# Patient Record
Sex: Male | Born: 1945 | Race: White | Hispanic: No | Marital: Single | Smoking: Former smoker
Health system: Southern US, Community
[De-identification: ages and names within clinical notes are randomized; demographics above are authoritative.]

## PROBLEM LIST (undated history)

## (undated) DIAGNOSIS — I639 Cerebral infarction, unspecified: Secondary | ICD-10-CM

## (undated) DIAGNOSIS — I10 Essential (primary) hypertension: Secondary | ICD-10-CM

## (undated) DIAGNOSIS — E7800 Pure hypercholesterolemia, unspecified: Secondary | ICD-10-CM

## (undated) HISTORY — PX: OTHER SURGICAL HISTORY: SHX169

---

## 2008-11-20 ENCOUNTER — Ambulatory Visit: Payer: Self-pay | Admitting: Urology

## 2008-11-24 ENCOUNTER — Ambulatory Visit: Payer: Self-pay | Admitting: Urology

## 2011-01-18 DIAGNOSIS — I639 Cerebral infarction, unspecified: Secondary | ICD-10-CM

## 2011-01-18 HISTORY — DX: Cerebral infarction, unspecified: I63.9

## 2011-06-14 ENCOUNTER — Inpatient Hospital Stay: Payer: Self-pay | Admitting: Specialist

## 2011-06-14 LAB — COMPREHENSIVE METABOLIC PANEL
Albumin: 4.2 g/dL (ref 3.4–5.0)
Alkaline Phosphatase: 72 U/L (ref 50–136)
Anion Gap: 7 (ref 7–16)
BUN: 17 mg/dL (ref 7–18)
Calcium, Total: 8.2 mg/dL — ABNORMAL LOW (ref 8.5–10.1)
Chloride: 108 mmol/L — ABNORMAL HIGH (ref 98–107)
Co2: 27 mmol/L (ref 21–32)
Creatinine: 0.91 mg/dL (ref 0.60–1.30)
EGFR (Non-African Amer.): >60
Glucose: 85 mg/dL (ref 65–99)
Osmolality: 284 (ref 275–301)
SGOT(AST): 34 U/L (ref 15–37)
SGPT (ALT): 42 U/L
Sodium: 142 mmol/L (ref 136–145)

## 2011-06-14 LAB — CBC
Platelet: 175 10*3/uL (ref 150–440)
RDW: 13.4 % (ref 11.5–14.5)

## 2011-06-14 LAB — TROPONIN I: Troponin-I: <0.02 ng/mL

## 2011-06-15 LAB — LIPID PANEL
Triglycerides: 189 mg/dL (ref 0–200)
VLDL Cholesterol, Calc: 38 mg/dL (ref 5–40)

## 2012-12-26 ENCOUNTER — Ambulatory Visit: Payer: Self-pay | Admitting: Gastroenterology

## 2014-02-26 DIAGNOSIS — E785 Hyperlipidemia, unspecified: Secondary | ICD-10-CM | POA: Diagnosis not present

## 2014-02-26 DIAGNOSIS — F329 Major depressive disorder, single episode, unspecified: Secondary | ICD-10-CM | POA: Diagnosis not present

## 2014-02-26 DIAGNOSIS — B002 Herpesviral gingivostomatitis and pharyngotonsillitis: Secondary | ICD-10-CM | POA: Diagnosis not present

## 2014-02-26 DIAGNOSIS — I10 Essential (primary) hypertension: Secondary | ICD-10-CM | POA: Diagnosis not present

## 2014-04-07 DIAGNOSIS — I10 Essential (primary) hypertension: Secondary | ICD-10-CM | POA: Diagnosis not present

## 2014-04-07 DIAGNOSIS — E780 Pure hypercholesterolemia: Secondary | ICD-10-CM | POA: Diagnosis not present

## 2014-04-07 DIAGNOSIS — F329 Major depressive disorder, single episode, unspecified: Secondary | ICD-10-CM | POA: Diagnosis not present

## 2014-04-07 DIAGNOSIS — Z125 Encounter for screening for malignant neoplasm of prostate: Secondary | ICD-10-CM | POA: Diagnosis not present

## 2014-05-11 NOTE — H&P (Signed)
PATIENT NAME:  Aaron Hubbard, Aaron Hubbard MR#:  [id number] DATE OF BIRTH:  05/29/1945  DATE OF ADMISSION:  06/14/2011  ER REFERRING PHYSICIAN: Dr. Mayford Knife  PRIMARY CARE PHYSICIAN: None.   CHIEF COMPLAINT: Sudden onset of lightheadedness, bilateral upper extremity weakness, and difficulty walking.   HISTORY OF PRESENT ILLNESS: The patient is a 69 year old male with no significant past medical history other than benign prostatic hypertrophy who was in his usual state of health until this morning. The patient reports that he got up without any problems and went to work. Within 15 minutes of starting work the patient felt lightheaded and had to lean against the wall to stabilize himself. He felt that both his upper extremities were weak and he was having difficulty with coordination and balance and difficulty walking. Therefore, he called his brother who brought him to the Emergency Room. The patient was found to have accelerated hypertension. The patient denies having any history of hypertension in the past. He denies any dizziness, tinnitus, chest pain, cough, shortness of breath, or any other symptoms. The patient reported at work he was doing his normal activities and was not doing any excessive strenuous physical activity when the symptoms happened.   PAST MEDICAL HISTORY: Benign prostatic hypertrophy.   MEDICATIONS: None.   PAST SURGICAL HISTORY: Green light laser BVP for benign prostatic hypertrophy by Dr. Evelene Croon in November 2010.   SOCIAL HISTORY: He denies any history of smoking, alcohol, or drug abuse. He still works and works for [employer].  FAMILY HISTORY: Mother had hypertension. Father had emphysema. A brother died of some kind of metastatic cancer. She denies any history of CVA or CAD.   REVIEW OF SYSTEMS: CONSTITUTIONAL: Denies any fever or fatigue. EYES: Denies any blurred or double vision. ENT: Denies any tinnitus or ear pain. RESPIRATORY: Denies any cough, wheezing, or fevers. CVS: Denies any  chest pain or palpitations. GI: Denies any nausea, vomiting, diarrhea, or abdominal pain. GU: Denies any dysuria or hematuria. ENDOCRINE: Denies any polyuria or nocturia. HEME/LYMPH: Denies any anemia or easy bruisability. INTEGUMENT: Denies any acne or rash. MUSCULOSKELETAL: Denies any swelling or gout. NEUROLOGICAL: Reports weakness in his upper extremities and difficulty walking. PSYCH: Denies any history of anxiety or depression.   PHYSICAL EXAMINATION:   VITAL SIGNS: Temperature 95.8, heart rate 71, respiratory rate 18, blood pressure 224/105, and pulse oximetry 97% on room air.   GENERAL: The patient is a well developed, well nourished Caucasian male lying comfortably in bed, not in acute distress.   HEAD: Atraumatic, normocephalic.   EYES: No pallor, icterus, or cyanosis. Pupils are equally round and reactive to light and accommodation. Extraocular movements are intact.   ENT: Wet mucous membranes. No oropharyngeal erythema or thrush.   NECK: Supple. No masses. No JVD. No thyromegaly. No lymphadenopathy.   CHEST WALL: No tenderness to palpation. Not using accessory muscles of respiration. No intercostal muscle retractions.   LUNGS: Bilaterally clear to auscultation. No wheezing, rales, or rhonchi.   HEART: S1 and S2 regular. No murmurs, rubs, or gallops.   ABDOMEN: Soft and nontender. No guarding or rigidity. No organomegaly. Normal bowel sounds.   SKIN: No rashes or lesions.   PERIPHERIES: Trace pedal edema, 1 to 2+ pedal pulses.   MUSCULOSKELETAL: No cyanosis or clubbing.   NEUROLOGIC: Awake, alert, and oriented x3. Cranial nerves are grossly intact. The patient is unable to do tandem walking. When he stands with his feet together and eyes closed he feels like he is drifting to his right  side. Motor strength is 5/5 in all extremities, however, the patient appears to have proximal muscle weakness when he tries to reach with his arms overhead. He also reports that he has some  difficulty while doing finger-nose testing.  PSYCH: Normal mood and affect.   RESULTS: CT of the head shows no acute abnormalities.  CBC: White count 5.8, hemoglobin 14.5, hematocrit 42, platelets 175, glucose 85, BUN 17, creatinine 0.91, sodium 142, potassium 3.8, chloride 108, CO2 27, calcium 8.2, bilirubin 1.1, alkaline phosphatase 72, ALT 42, AST 34. Cardiac enzymes negative.   ASSESSMENT AND PLAN: A 69 year old male with no significant past medical history who presents with sudden onset of lightheadedness, bilateral upper and lower extremity weakness and difficulty with balance.  1. Possible cerebrovascular accident. The patient's initial CAT scan of the head is negative. We will obtain a MRI of the brain, echocardiogram, and carotid ultrasound. We will start the patient on full dose aspirin, check a fasting lipid profile. Depending on the results, he may need to be started on a statin. We will also obtain PT and OT and case management consult for disposition/discharge planning.  2. Accelerated hypertension. The patient has no history of hypertension in the past. We will start him on Lopressor, Norvasc, HCTZ, and p.r.n. hydralazine. We will adjust medications as needed to achieve good hypertensive control. We will avoid dropping the patient's blood pressure too low given his possible cerebrovascular accident. We will place on GI and DVT  prophylaxis. The patient will need a PCP at the time of discharge.   I reviewed all medical records, discussed with the ED physician, and discussed with the patient the plan of care and management.   TIME SPENT: 75 minutes.  ____________________________ Darrick MeigsSangeeta Aivah Putman, MD sp:slb D: 06/14/2011 14:19:57 ET T: 06/14/2011 15:09:43 ET JOB#: [id number]  cc: Darrick MeigsSangeeta Briunna Leicht, MD, <Dictator> Darrick MeigsSANGEETA Pelham Hennick MD ELECTRONICALLY SIGNED 06/15/2011 15:00

## 2014-05-11 NOTE — Discharge Summary (Signed)
PATIENT NAME:  Aaron SalisburyWEBSTER, Ravindra G MR#:  [id number] DATE OF BIRTH:  [birth year]  DATE OF ADMISSION:  06/14/2011 DATE OF DISCHARGE:  06/15/2011  For a detailed note, please take a look at the history and physical done on admission by Dr. Darrick MeigsSangeeta Panwar.   DISCHARGE DIAGNOSES: 1. Acute/subacute cerebrovascular accident.  2. Hypertension.  3. Hyperlipidemia.  4. Benign prostatic hypertrophy.   DIET: The patient is being discharged on a low-sodium, low-fat diet.   ACTIVITY: As tolerated.   DISCHARGE FOLLOWUP: Followup in the next 1 to 2 weeks. The patient is to get himself a primary care physician. [facility].   DISCHARGE MEDICATIONS:  1. Aspirin 81 mg daily.  2. Lisinopril 5 mg daily.  3. Metoprolol titrate 25 mg twice a day. 4. Hydrochlorothiazide 25 mg daily.  5. Pravachol 20 mg daily.   DISCHARGE INSTRUCTIONS: The patient is being referred for outpatient occupational services.   PERTINENT STUDIES: CT scan of the head done without contrast on admission showed no acute process.   MRI of the brain done without contrast showed findings consistent with areas of subacute lacunar infarction within the left lobe of the thalamus, moderate involutional changes.   Ultrasound of carotids showed no sonographic evidence of hemodynamically significant carotid stenosis.   A two-dimensional echocardiogram showed left ventricular systolic function to be normal, no thrombus, ejection fraction to be 55%.   HOSPITAL COURSE: This is a 69 year old male with medical problems as mentioned above who presented to the hospital on 06/14/2011 secondary to sudden onset of lightheadedness, bilateral upper extremity weakness, and difficulty with balance.  1. Subacute/acute cerebrovascular accident: This was the likely cause of the patient's symptoms of weakness and dizziness. His dizziness had pretty much resolved, although he did still have some right arm  weakness. His MRI confirmed a stroke as mentioned. He was started on aspirin while he was here. His lipid profile was checked which was noted to be elevated. Therefore, he was started on a statin. A physical therapy consult was obtained, although the patient did not require any physical therapy services. Because of his right hand weakness, he was referred for outpatient OT.  2. Hypertension: The patient was not taking any medications prior to coming in. His systolic blood pressures were significantly elevated when he presented, as high as over 180 to 200.  They had much improved upon discharge. He currently is being discharged on hydrochlorothiazide, lisinopril, and metoprolol as stated.  3. Hyperlipidemia: The patient's lipid profile was checked. He had a total cholesterol over 200. He was started on a statin.   CODE STATUS: FULL CODE.  TIME SPENT: 40 minutes. ____________________________ Rolly PancakeVivek J. Cherlynn KaiserSainani, MD vjs:slb D: 06/15/2011 15:40:15 ET T: 06/16/2011 12:16:17 ET JOB#: [id number]  cc: Rolly PancakeVivek J. Cherlynn KaiserSainani, MD, <Dictator> Houston SirenVIVEK J Jerilyn Gillaspie MD ELECTRONICALLY SIGNED 06/17/2011 16:15

## 2014-05-28 DIAGNOSIS — H3531 Nonexudative age-related macular degeneration: Secondary | ICD-10-CM | POA: Diagnosis not present

## 2014-05-28 DIAGNOSIS — H35363 Drusen (degenerative) of macula, bilateral: Secondary | ICD-10-CM | POA: Diagnosis not present

## 2014-09-17 DIAGNOSIS — E780 Pure hypercholesterolemia: Secondary | ICD-10-CM | POA: Diagnosis not present

## 2014-09-17 DIAGNOSIS — G4733 Obstructive sleep apnea (adult) (pediatric): Secondary | ICD-10-CM | POA: Diagnosis not present

## 2014-09-17 DIAGNOSIS — I10 Essential (primary) hypertension: Secondary | ICD-10-CM | POA: Diagnosis not present

## 2014-09-17 DIAGNOSIS — M7582 Other shoulder lesions, left shoulder: Secondary | ICD-10-CM | POA: Diagnosis not present

## 2014-09-24 DIAGNOSIS — H3531 Nonexudative age-related macular degeneration: Secondary | ICD-10-CM | POA: Diagnosis not present

## 2014-10-06 DIAGNOSIS — M7582 Other shoulder lesions, left shoulder: Secondary | ICD-10-CM | POA: Diagnosis not present

## 2014-10-06 DIAGNOSIS — I10 Essential (primary) hypertension: Secondary | ICD-10-CM | POA: Diagnosis not present

## 2014-10-16 ENCOUNTER — Ambulatory Visit: Payer: Commercial Managed Care - HMO | Attending: Otolaryngology

## 2014-10-16 DIAGNOSIS — R0683 Snoring: Secondary | ICD-10-CM | POA: Diagnosis not present

## 2014-10-16 DIAGNOSIS — G4761 Periodic limb movement disorder: Secondary | ICD-10-CM | POA: Diagnosis not present

## 2014-10-16 DIAGNOSIS — G4733 Obstructive sleep apnea (adult) (pediatric): Secondary | ICD-10-CM | POA: Diagnosis not present

## 2014-11-18 DIAGNOSIS — G4733 Obstructive sleep apnea (adult) (pediatric): Secondary | ICD-10-CM | POA: Diagnosis not present

## 2014-12-01 DIAGNOSIS — Z125 Encounter for screening for malignant neoplasm of prostate: Secondary | ICD-10-CM | POA: Diagnosis not present

## 2014-12-01 DIAGNOSIS — I83893 Varicose veins of bilateral lower extremities with other complications: Secondary | ICD-10-CM | POA: Diagnosis not present

## 2014-12-01 DIAGNOSIS — K644 Residual hemorrhoidal skin tags: Secondary | ICD-10-CM | POA: Diagnosis not present

## 2014-12-01 DIAGNOSIS — Z23 Encounter for immunization: Secondary | ICD-10-CM | POA: Diagnosis not present

## 2014-12-01 DIAGNOSIS — Z0000 Encounter for general adult medical examination without abnormal findings: Secondary | ICD-10-CM | POA: Diagnosis not present

## 2014-12-01 DIAGNOSIS — I10 Essential (primary) hypertension: Secondary | ICD-10-CM | POA: Diagnosis not present

## 2014-12-18 DIAGNOSIS — G4733 Obstructive sleep apnea (adult) (pediatric): Secondary | ICD-10-CM | POA: Diagnosis not present

## 2015-01-18 DIAGNOSIS — G4733 Obstructive sleep apnea (adult) (pediatric): Secondary | ICD-10-CM | POA: Diagnosis not present

## 2015-01-22 DIAGNOSIS — G4733 Obstructive sleep apnea (adult) (pediatric): Secondary | ICD-10-CM | POA: Diagnosis not present

## 2015-02-18 DIAGNOSIS — G4733 Obstructive sleep apnea (adult) (pediatric): Secondary | ICD-10-CM | POA: Diagnosis not present

## 2015-03-03 DIAGNOSIS — M7582 Other shoulder lesions, left shoulder: Secondary | ICD-10-CM | POA: Diagnosis not present

## 2015-03-03 DIAGNOSIS — E7800 Pure hypercholesterolemia, unspecified: Secondary | ICD-10-CM | POA: Diagnosis not present

## 2015-03-03 DIAGNOSIS — F325 Major depressive disorder, single episode, in full remission: Secondary | ICD-10-CM | POA: Diagnosis not present

## 2015-03-03 DIAGNOSIS — I10 Essential (primary) hypertension: Secondary | ICD-10-CM | POA: Diagnosis not present

## 2015-03-18 DIAGNOSIS — G4733 Obstructive sleep apnea (adult) (pediatric): Secondary | ICD-10-CM | POA: Diagnosis not present

## 2015-04-08 DIAGNOSIS — I10 Essential (primary) hypertension: Secondary | ICD-10-CM | POA: Diagnosis not present

## 2015-04-08 DIAGNOSIS — M5412 Radiculopathy, cervical region: Secondary | ICD-10-CM | POA: Diagnosis not present

## 2015-04-08 DIAGNOSIS — M47812 Spondylosis without myelopathy or radiculopathy, cervical region: Secondary | ICD-10-CM | POA: Diagnosis not present

## 2015-04-18 DIAGNOSIS — G4733 Obstructive sleep apnea (adult) (pediatric): Secondary | ICD-10-CM | POA: Diagnosis not present

## 2015-04-27 DIAGNOSIS — G4733 Obstructive sleep apnea (adult) (pediatric): Secondary | ICD-10-CM | POA: Diagnosis not present

## 2015-05-18 DIAGNOSIS — G4733 Obstructive sleep apnea (adult) (pediatric): Secondary | ICD-10-CM | POA: Diagnosis not present

## 2015-05-26 DIAGNOSIS — I10 Essential (primary) hypertension: Secondary | ICD-10-CM | POA: Diagnosis not present

## 2015-05-27 DIAGNOSIS — H353123 Nonexudative age-related macular degeneration, left eye, advanced atrophic without subfoveal involvement: Secondary | ICD-10-CM | POA: Diagnosis not present

## 2015-05-27 DIAGNOSIS — H353112 Nonexudative age-related macular degeneration, right eye, intermediate dry stage: Secondary | ICD-10-CM | POA: Diagnosis not present

## 2015-05-27 DIAGNOSIS — H35423 Microcystoid degeneration of retina, bilateral: Secondary | ICD-10-CM | POA: Diagnosis not present

## 2015-06-02 DIAGNOSIS — M5412 Radiculopathy, cervical region: Secondary | ICD-10-CM | POA: Diagnosis not present

## 2015-06-09 DIAGNOSIS — H353132 Nonexudative age-related macular degeneration, bilateral, intermediate dry stage: Secondary | ICD-10-CM | POA: Diagnosis not present

## 2015-06-09 DIAGNOSIS — H2512 Age-related nuclear cataract, left eye: Secondary | ICD-10-CM | POA: Diagnosis not present

## 2015-06-09 DIAGNOSIS — Z961 Presence of intraocular lens: Secondary | ICD-10-CM | POA: Diagnosis not present

## 2015-06-18 DIAGNOSIS — G4733 Obstructive sleep apnea (adult) (pediatric): Secondary | ICD-10-CM | POA: Diagnosis not present

## 2015-07-18 DIAGNOSIS — G4733 Obstructive sleep apnea (adult) (pediatric): Secondary | ICD-10-CM | POA: Diagnosis not present

## 2015-08-18 DIAGNOSIS — G4733 Obstructive sleep apnea (adult) (pediatric): Secondary | ICD-10-CM | POA: Diagnosis not present

## 2015-09-02 DIAGNOSIS — F325 Major depressive disorder, single episode, in full remission: Secondary | ICD-10-CM | POA: Diagnosis not present

## 2015-09-02 DIAGNOSIS — Z1159 Encounter for screening for other viral diseases: Secondary | ICD-10-CM | POA: Diagnosis not present

## 2015-09-02 DIAGNOSIS — E7800 Pure hypercholesterolemia, unspecified: Secondary | ICD-10-CM | POA: Diagnosis not present

## 2015-09-02 DIAGNOSIS — Z131 Encounter for screening for diabetes mellitus: Secondary | ICD-10-CM | POA: Diagnosis not present

## 2015-09-02 DIAGNOSIS — I10 Essential (primary) hypertension: Secondary | ICD-10-CM | POA: Diagnosis not present

## 2015-09-18 DIAGNOSIS — H2512 Age-related nuclear cataract, left eye: Secondary | ICD-10-CM | POA: Diagnosis not present

## 2015-09-18 DIAGNOSIS — H353131 Nonexudative age-related macular degeneration, bilateral, early dry stage: Secondary | ICD-10-CM | POA: Diagnosis not present

## 2015-09-18 DIAGNOSIS — G4733 Obstructive sleep apnea (adult) (pediatric): Secondary | ICD-10-CM | POA: Diagnosis not present

## 2015-10-19 DIAGNOSIS — R3914 Feeling of incomplete bladder emptying: Secondary | ICD-10-CM | POA: Diagnosis not present

## 2015-10-19 DIAGNOSIS — R3129 Other microscopic hematuria: Secondary | ICD-10-CM | POA: Diagnosis not present

## 2015-10-19 DIAGNOSIS — N401 Enlarged prostate with lower urinary tract symptoms: Secondary | ICD-10-CM | POA: Diagnosis not present

## 2015-10-19 DIAGNOSIS — R350 Frequency of micturition: Secondary | ICD-10-CM | POA: Diagnosis not present

## 2015-10-19 DIAGNOSIS — R3915 Urgency of urination: Secondary | ICD-10-CM | POA: Diagnosis not present

## 2015-10-19 DIAGNOSIS — R9721 Rising PSA following treatment for malignant neoplasm of prostate: Secondary | ICD-10-CM | POA: Diagnosis not present

## 2015-10-19 DIAGNOSIS — R310 Gross hematuria: Secondary | ICD-10-CM | POA: Diagnosis not present

## 2015-10-28 DIAGNOSIS — R3129 Other microscopic hematuria: Secondary | ICD-10-CM | POA: Diagnosis not present

## 2015-11-02 DIAGNOSIS — R972 Elevated prostate specific antigen [PSA]: Secondary | ICD-10-CM | POA: Diagnosis not present

## 2015-11-02 DIAGNOSIS — N390 Urinary tract infection, site not specified: Secondary | ICD-10-CM | POA: Diagnosis not present

## 2015-11-27 DIAGNOSIS — I10 Essential (primary) hypertension: Secondary | ICD-10-CM | POA: Diagnosis not present

## 2015-11-27 DIAGNOSIS — Z1159 Encounter for screening for other viral diseases: Secondary | ICD-10-CM | POA: Diagnosis not present

## 2015-11-27 DIAGNOSIS — Z131 Encounter for screening for diabetes mellitus: Secondary | ICD-10-CM | POA: Diagnosis not present

## 2015-12-04 DIAGNOSIS — Z0000 Encounter for general adult medical examination without abnormal findings: Secondary | ICD-10-CM | POA: Diagnosis not present

## 2015-12-04 DIAGNOSIS — Z23 Encounter for immunization: Secondary | ICD-10-CM | POA: Diagnosis not present

## 2015-12-22 DIAGNOSIS — G4733 Obstructive sleep apnea (adult) (pediatric): Secondary | ICD-10-CM | POA: Diagnosis not present

## 2016-02-02 DIAGNOSIS — R972 Elevated prostate specific antigen [PSA]: Secondary | ICD-10-CM | POA: Diagnosis not present

## 2016-03-31 DIAGNOSIS — G4733 Obstructive sleep apnea (adult) (pediatric): Secondary | ICD-10-CM | POA: Diagnosis not present

## 2016-05-25 DIAGNOSIS — H353123 Nonexudative age-related macular degeneration, left eye, advanced atrophic without subfoveal involvement: Secondary | ICD-10-CM | POA: Diagnosis not present

## 2016-05-25 DIAGNOSIS — H43821 Vitreomacular adhesion, right eye: Secondary | ICD-10-CM | POA: Diagnosis not present

## 2016-05-25 DIAGNOSIS — H353112 Nonexudative age-related macular degeneration, right eye, intermediate dry stage: Secondary | ICD-10-CM | POA: Diagnosis not present

## 2016-05-25 DIAGNOSIS — H35423 Microcystoid degeneration of retina, bilateral: Secondary | ICD-10-CM | POA: Diagnosis not present

## 2016-06-02 DIAGNOSIS — E7800 Pure hypercholesterolemia, unspecified: Secondary | ICD-10-CM | POA: Diagnosis not present

## 2016-06-02 DIAGNOSIS — F324 Major depressive disorder, single episode, in partial remission: Secondary | ICD-10-CM | POA: Diagnosis not present

## 2016-06-02 DIAGNOSIS — Z0000 Encounter for general adult medical examination without abnormal findings: Secondary | ICD-10-CM | POA: Diagnosis not present

## 2016-06-02 DIAGNOSIS — I10 Essential (primary) hypertension: Secondary | ICD-10-CM | POA: Diagnosis not present

## 2016-06-15 DIAGNOSIS — M8588 Other specified disorders of bone density and structure, other site: Secondary | ICD-10-CM | POA: Diagnosis not present

## 2016-07-14 DIAGNOSIS — G4733 Obstructive sleep apnea (adult) (pediatric): Secondary | ICD-10-CM | POA: Diagnosis not present

## 2016-09-21 DIAGNOSIS — E7800 Pure hypercholesterolemia, unspecified: Secondary | ICD-10-CM | POA: Diagnosis not present

## 2016-09-21 DIAGNOSIS — Z125 Encounter for screening for malignant neoplasm of prostate: Secondary | ICD-10-CM | POA: Diagnosis not present

## 2016-09-21 DIAGNOSIS — F325 Major depressive disorder, single episode, in full remission: Secondary | ICD-10-CM | POA: Diagnosis not present

## 2016-09-21 DIAGNOSIS — Z1159 Encounter for screening for other viral diseases: Secondary | ICD-10-CM | POA: Diagnosis not present

## 2016-09-21 DIAGNOSIS — R739 Hyperglycemia, unspecified: Secondary | ICD-10-CM | POA: Diagnosis not present

## 2016-09-21 DIAGNOSIS — I10 Essential (primary) hypertension: Secondary | ICD-10-CM | POA: Diagnosis not present

## 2016-09-23 DIAGNOSIS — H2512 Age-related nuclear cataract, left eye: Secondary | ICD-10-CM | POA: Diagnosis not present

## 2016-09-23 DIAGNOSIS — H353131 Nonexudative age-related macular degeneration, bilateral, early dry stage: Secondary | ICD-10-CM | POA: Diagnosis not present

## 2016-10-24 DIAGNOSIS — G4733 Obstructive sleep apnea (adult) (pediatric): Secondary | ICD-10-CM | POA: Diagnosis not present

## 2016-12-05 DIAGNOSIS — R739 Hyperglycemia, unspecified: Secondary | ICD-10-CM | POA: Diagnosis not present

## 2016-12-05 DIAGNOSIS — Z1159 Encounter for screening for other viral diseases: Secondary | ICD-10-CM | POA: Diagnosis not present

## 2016-12-05 DIAGNOSIS — I10 Essential (primary) hypertension: Secondary | ICD-10-CM | POA: Diagnosis not present

## 2016-12-05 DIAGNOSIS — Z125 Encounter for screening for malignant neoplasm of prostate: Secondary | ICD-10-CM | POA: Diagnosis not present

## 2016-12-05 DIAGNOSIS — E7800 Pure hypercholesterolemia, unspecified: Secondary | ICD-10-CM | POA: Diagnosis not present

## 2016-12-12 DIAGNOSIS — E875 Hyperkalemia: Secondary | ICD-10-CM | POA: Diagnosis not present

## 2016-12-12 DIAGNOSIS — Z0000 Encounter for general adult medical examination without abnormal findings: Secondary | ICD-10-CM | POA: Diagnosis not present

## 2016-12-12 DIAGNOSIS — Z23 Encounter for immunization: Secondary | ICD-10-CM | POA: Diagnosis not present

## 2017-02-22 ENCOUNTER — Emergency Department: Payer: Medicare HMO

## 2017-02-22 ENCOUNTER — Encounter: Payer: Self-pay | Admitting: Emergency Medicine

## 2017-02-22 ENCOUNTER — Observation Stay
Admission: EM | Admit: 2017-02-22 | Discharge: 2017-02-23 | Disposition: A | Discharge status: Home / Self Care | Payer: Medicare HMO | Attending: Internal Medicine | Admitting: Internal Medicine

## 2017-02-22 DIAGNOSIS — F329 Major depressive disorder, single episode, unspecified: Secondary | ICD-10-CM | POA: Diagnosis not present

## 2017-02-22 DIAGNOSIS — G3189 Other specified degenerative diseases of nervous system: Secondary | ICD-10-CM | POA: Insufficient documentation

## 2017-02-22 DIAGNOSIS — M6281 Muscle weakness (generalized): Secondary | ICD-10-CM | POA: Diagnosis not present

## 2017-02-22 DIAGNOSIS — F32A Depression, unspecified: Secondary | ICD-10-CM | POA: Diagnosis present

## 2017-02-22 DIAGNOSIS — G459 Transient cerebral ischemic attack, unspecified: Secondary | ICD-10-CM | POA: Diagnosis not present

## 2017-02-22 DIAGNOSIS — Z7982 Long term (current) use of aspirin: Secondary | ICD-10-CM | POA: Diagnosis not present

## 2017-02-22 DIAGNOSIS — E785 Hyperlipidemia, unspecified: Secondary | ICD-10-CM | POA: Insufficient documentation

## 2017-02-22 DIAGNOSIS — E7800 Pure hypercholesterolemia, unspecified: Secondary | ICD-10-CM | POA: Diagnosis not present

## 2017-02-22 DIAGNOSIS — I6523 Occlusion and stenosis of bilateral carotid arteries: Secondary | ICD-10-CM | POA: Insufficient documentation

## 2017-02-22 DIAGNOSIS — Z87891 Personal history of nicotine dependence: Secondary | ICD-10-CM | POA: Diagnosis not present

## 2017-02-22 DIAGNOSIS — I071 Rheumatic tricuspid insufficiency: Secondary | ICD-10-CM | POA: Insufficient documentation

## 2017-02-22 DIAGNOSIS — Z79899 Other long term (current) drug therapy: Secondary | ICD-10-CM | POA: Insufficient documentation

## 2017-02-22 DIAGNOSIS — I10 Essential (primary) hypertension: Secondary | ICD-10-CM | POA: Diagnosis not present

## 2017-02-22 DIAGNOSIS — R29818 Other symptoms and signs involving the nervous system: Secondary | ICD-10-CM | POA: Diagnosis not present

## 2017-02-22 DIAGNOSIS — R200 Anesthesia of skin: Secondary | ICD-10-CM | POA: Diagnosis present

## 2017-02-22 HISTORY — DX: Pure hypercholesterolemia, unspecified: E78.00

## 2017-02-22 HISTORY — DX: Essential (primary) hypertension: I10

## 2017-02-22 HISTORY — DX: Cerebral infarction, unspecified: I63.9

## 2017-02-22 LAB — CBC
HCT: 40.3 % (ref 40.0–52.0)
Hemoglobin: 13.9 g/dL (ref 13.0–18.0)
MCH: 32.3 pg (ref 26.0–34.0)
MCHC: 34.5 g/dL (ref 32.0–36.0)
MCV: 93.5 fL (ref 80.0–100.0)
PLATELETS: 186 10*3/uL (ref 150–440)
RBC: 4.31 MIL/uL — ABNORMAL LOW (ref 4.40–5.90)
RDW: 13.2 % (ref 11.5–14.5)
WBC: 5.8 10*3/uL (ref 3.8–10.6)

## 2017-02-22 LAB — BASIC METABOLIC PANEL
Anion gap: 7 (ref 5–15)
BUN: 25 mg/dL — AB (ref 6–20)
CHLORIDE: 105 mmol/L (ref 101–111)
CO2: 28 mmol/L (ref 22–32)
CREATININE: 1.21 mg/dL (ref 0.61–1.24)
Calcium: 9.1 mg/dL (ref 8.9–10.3)
GFR calc Af Amer: >60 mL/min (ref >60)
GFR, EST NON AFRICAN AMERICAN: 59 mL/min — AB (ref >60)
Glucose, Bld: 116 mg/dL — ABNORMAL HIGH (ref 65–99)
Potassium: 4 mmol/L (ref 3.5–5.1)
SODIUM: 140 mmol/L (ref 135–145)

## 2017-02-22 LAB — TROPONIN I: Troponin I: <0.03 ng/mL (ref <0.03)

## 2017-02-22 NOTE — ED Notes (Signed)
Pt denies CP, SHOB, dizziness, nausea or lightheadedness at this time. Pt states elevated BP for the last few weeks, states he does take his BP medications. Pt states he has a BP reader at home and reports "180's/100's." Pt states "I can feel it when it's up all over." Pt states last night he woke up and had left arm numbness "for a few seconds." Pt states he went back to bed and woke up being able to move his arm without difficulty. Pt able to move left arm at this time, pulses intact, full ROM. Pt states hx of TIA in 2013.

## 2017-02-22 NOTE — H&P (Signed)
[facility] [place] [facility]   PATIENT NAME: Aaron Hubbard    MR#:  [id number]  DATE OF BIRTH:  March 01, 1945  DATE OF ADMISSION:  02/22/2017  PRIMARY CARE PHYSICIAN: No primary care provider on file.   REQUESTING/REFERRING PHYSICIAN: Darnelle Catalan, MD  CHIEF COMPLAINT:   Chief Complaint  Patient presents with  . Hypertension    HISTORY OF PRESENT ILLNESS:  Aaron Hubbard  is a 72 y.o. male who presents with an episode of left-sided doubt this.  Patient states that he was sleeping last night when he woke up, sleeping on his right side, but had left sided numbness and "hard time using his hand".  Patient had a stroke several years ago, and his symptoms were the same only on his right side.  Here in the ED he seems to have returned to his usual state of function, with symptoms having resolved.  However, given his history hospitalist were called for admission and evaluation for possible stroke or TIA.  PAST MEDICAL HISTORY:   Past Medical History:  Diagnosis Date  . Hypercholesteremia   . Hypertension   . Stroke (cerebrum) [facility]) 2013    PAST SURGICAL HISTORY:   Past Surgical History:  Procedure Laterality Date  . Photoselective vaporization of prostate      SOCIAL HISTORY:   Social History   Tobacco Use  . Smoking status: Former Games developer  . Smokeless tobacco: Never Used  Substance Use Topics  . Alcohol use: Yes    FAMILY HISTORY:   Family History  Problem Relation Age of Onset  . Hypertension Mother   . Cancer Brother   . Emphysema Father     DRUG ALLERGIES:  No Known Allergies  MEDICATIONS AT HOME:   Prior to Admission medications   Medication Sig Start Date End Date Taking? Authorizing Provider  aspirin EC 81 MG tablet Take 1 tablet by mouth every evening.    Yes [provider]  calcium-vitamin D (CALCIUM 500/D) 500-200 MG-UNIT tablet Take 1 tablet by mouth 2 (two) times daily.   Yes [provider]   citalopram (CELEXA) 40 MG tablet Take one tablet daily. In the evening 06/02/16  Yes [provider]  lisinopril (PRINIVIL,ZESTRIL) 10 MG tablet TAKE 1 TABLET EVERY DAY 11/29/16  Yes [provider]  meloxicam (MOBIC) 15 MG tablet TAKE 1 TABLET EVERY DAY 11/29/16  Yes [provider]  Multiple Vitamins-Minerals (ICAPS AREDS 2) CAPS Take 1 capsule by mouth 2 (two) times daily.    Yes [provider]  pravastatin (PRAVACHOL) 10 MG tablet TAKE 1 TABLET EVERY NIGHT 11/29/16  Yes [provider]  tiZANidine (ZANAFLEX) 4 MG tablet TAKE 1 TABLET EVERY NIGHT AS NEEDED FOR MUSCLE SPASMS 01/03/17  Yes [provider]  valACYclovir (VALTREX) 1000 MG tablet TAKE 2 TABLETS TWICE DAILY FOR 1 DAY. FOR ACUTE EPISODE OF HERPES LABIALIS AS NEEDED 02/17/17  Yes [provider]    REVIEW OF SYSTEMS:  Review of Systems  Constitutional: Negative for chills, fever, malaise/fatigue and weight loss.  HENT: Negative for ear pain, hearing loss and tinnitus.   Eyes: Negative for blurred vision, double vision, pain and redness.  Respiratory: Negative for cough, hemoptysis and shortness of breath.   Cardiovascular: Negative for chest pain, palpitations, orthopnea and leg swelling.  Gastrointestinal: Negative for abdominal pain, constipation, diarrhea, nausea and vomiting.  Genitourinary: Negative for dysuria, frequency and hematuria.  Musculoskeletal: Negative for back pain, joint pain and neck pain.  Skin:  No acne, rash, or lesions  Neurological: Positive for sensory change and focal weakness. Negative for dizziness, tremors and weakness.  Endo/Heme/Allergies: Negative for polydipsia. Does not bruise/bleed easily.  Psychiatric/Behavioral: Negative for depression. The patient is not nervous/anxious and does not have insomnia.      VITAL SIGNS:   Vitals:   02/22/17 1805 02/22/17 2251  BP: (!) 170/94 (!) 165/92  Pulse: 64   Resp: 18   Temp: 98.3 F  (36.8 C)   TempSrc: Oral   SpO2: 98%   Weight: 81.6 kg (180 lb)   Height: 5\' 5"  (1.651 m)    Wt Readings from Last 3 Encounters:  02/22/17 81.6 kg (180 lb)    PHYSICAL EXAMINATION:  Physical Exam  Vitals reviewed. Constitutional: He is oriented to person, place, and time. He appears well-developed and well-nourished. No distress.  HENT:  Head: Normocephalic and atraumatic.  Mouth/Throat: Oropharynx is clear and moist.  Eyes: Conjunctivae and EOM are normal. Pupils are equal, round, and reactive to light. No scleral icterus.  Neck: Normal range of motion. Neck supple. No JVD present. No thyromegaly present.  Cardiovascular: Normal rate, regular rhythm and intact distal pulses. Exam reveals no gallop and no friction rub.  No murmur heard. Respiratory: Effort normal and breath sounds normal. No respiratory distress. He has no wheezes. He has no rales.  GI: Soft. Bowel sounds are normal. He exhibits no distension. There is no tenderness.  Musculoskeletal: Normal range of motion. He exhibits no edema.  No arthritis, no gout  Lymphadenopathy:    He has no cervical adenopathy.  Neurological: He is alert and oriented to person, place, and time. No cranial nerve deficit.  No dysarthria, no aphasia  Skin: Skin is warm and dry. No rash noted. No erythema.  Psychiatric: He has a normal mood and affect. His behavior is normal. Judgment and thought content normal.    LABORATORY PANEL:   CBC Recent Labs  Lab 02/22/17 1807  WBC 5.8  HGB 13.9  HCT 40.3  PLT 186   ------------------------------------------------------------------------------------------------------------------  Chemistries  Recent Labs  Lab 02/22/17 1807  NA 140  K 4.0  CL 105  CO2 28  GLUCOSE 116*  BUN 25*  CREATININE 1.21  CALCIUM 9.1   ------------------------------------------------------------------------------------------------------------------  Cardiac Enzymes Recent Labs  Lab 02/22/17 1807   TROPONINI <0.03   ------------------------------------------------------------------------------------------------------------------  RADIOLOGY:  Ct Head Wo Contrast  Result Date: 02/22/2017 CLINICAL DATA:  Focal neuro deficit greater than 6 hours. Left arm numbness EXAM: CT HEAD WITHOUT CONTRAST TECHNIQUE: Contiguous axial images were obtained from the base of the skull through the vertex without intravenous contrast. COMPARISON:  CT head 06/14/2011 FINDINGS: Brain: Mild atrophy. Negative for acute infarct, hemorrhage, mass. Mild chronic microvascular ischemic changes best seen on prior MRI. Vascular: Negative for vascular thrombosis Skull: Negative Sinuses/Orbits: Negative Other: None IMPRESSION: No acute abnormality.  Negative for age. Electronically Signed   By: Marlan Palauharles  Clark M.D.   On: 02/22/2017 20:05    EKG:   Orders placed or performed during the hospital encounter of 02/22/17  . ED EKG within 10 minutes  . ED EKG within 10 minutes    IMPRESSION AND PLAN:  Principal Problem:   Left sided numbness -admit per stroke/TIA admission order set with appropriate imaging consults and labs Active Problems:   HTN (hypertension) -permissive hypertension for tonight blood pressure goal less than 220/120   Depression -continue home meds   HLD (hyperlipidemia) -continue home meds  All the records are reviewed and  case discussed with ED provider. Management plans discussed with the patient and/or family.  DVT PROPHYLAXIS: SubQ lovenox  GI PROPHYLAXIS: None  ADMISSION STATUS: Observation  CODE STATUS: Full Code Status History    This patient does not have a recorded code status. Please follow your organizational policy for patients in this situation.      TOTAL TIME TAKING CARE OF THIS PATIENT: 40 minutes.   Dam Ashraf FIELDING 02/22/2017, 11:23 PM  [employer]  [phone]  CC: Primary care physician; No primary care provider on file.  Note:   This document was prepared using Dragon voice recognition software and may include unintentional dictation errors.

## 2017-02-22 NOTE — ED Provider Notes (Signed)
[facility] Emergency Department Provider Note   ____________________________________________   First MD Initiated Contact with Patient 02/22/17 2249     (approximate)  I have reviewed the triage vital signs and the nursing notes.   HISTORY  Chief Complaint Hypertension    HPI Aaron Hubbard is a 72 y.o. male Patient reports his blood pressure been up for the last couple days. He had a stroke in 2013 documented on MRI and blood pressure is 200 or more at that point. Tonight he was sleeping and woke up he was laying on his right side and his left arm was numb and his left hand would not work. This resolved very quickly after he woke up he couldn't say how fast but probably within a few minutes. At the present time he is feeling well blood pressure was elevated in the emergency room at 170/194. His not having any headache or numbness or weakness or any other problems at present.  Past Medical History:  Diagnosis Date  . Hypercholesteremia   . Hypertension   . Stroke (cerebrum) (H[facility]) 2013    There are no active problems to display for this patient.   History reviewed. No pertinent surgical history.  Prior to Admission medications   Not on File    Allergies Patient has no known allergies.  No family history on file.  Social History Social History   Tobacco Use  . Smoking status: Former Games developer  . Smokeless tobacco: Never Used  Substance Use Topics  . Alcohol use: Yes  . Drug use: No    Review of Systems  Constitutional: No fever/chills Eyes: No visual changes. ENT: No sore throat. Cardiovascular: Denies chest pain. Respiratory: Denies shortness of breath. Gastrointestinal: No abdominal pain.  No nausea, no vomiting.  No diarrhea.  No constipation. Genitourinary: Negative for dysuria. Musculoskeletal: Negative for back pain. Skin: Negative for rash. Neurological:see history of present  illness ____________________________________________   PHYSICAL EXAM:  VITAL SIGNS: ED Triage Vitals [02/22/17 1805]  Enc Vitals Group     BP (!) 170/94     Pulse Rate 64     Resp 18     Temp 98.3 F (36.8 C)     Temp Source Oral     SpO2 98 %     Weight 180 lb (81.6 kg)     Height 5\' 5"  (1.651 m)     Head Circumference      Peak Flow      Pain Score      Pain Loc      Pain Edu?      Excl. in GC?     Constitutional: Alert and oriented. Well appearing and in no acute distress. Eyes: Conjunctivae are normal. PER. EOMI. Head: Atraumatic. Nose: No congestion/rhinnorhea. Mouth/Throat: Mucous membranes are moist.  Oropharynx non-erythematous. Neck: No stridor.   Cardiovascular: Normal rate, regular rhythm. Grossly normal heart sounds.  Good peripheral circulation. Respiratory: Normal respiratory effort.  No retractions. Lungs CTAB. Gastrointestinal: Soft and nontender. No distention. No abdominal bruits. No CVA tenderness. Musculoskeletal: No lower extremity tenderness nor edema.  No joint effusions. Neurologic:  Normal speech and language. No gross focal neurologic deficits are appreciated. specifically cranial nerves II through XII are intact. Visual fields were not checked cerebellar finger-nose rapid alternating movements and hands are normal motor strength is 5 over 5 throughout and sensation is intact throughout Skin:  Skin is warm, dry and intact. No rash noted. Psychiatric: Mood and affect are normal. Speech  and behavior are normal.  ____________________________________________   LABS (all labs ordered are listed, but only abnormal results are displayed)  Labs Reviewed  BASIC METABOLIC PANEL - Abnormal; Notable for the following components:      Result Value   Glucose, Bld 116 (*)    BUN 25 (*)    GFR calc non Af Amer 59 (*)    All other components within normal limits  CBC - Abnormal; Notable for the following components:   RBC 4.31 (*)    All other components  within normal limits  TROPONIN I   ____________________________________________  EKG  EKG read and interpreted by me shows normal sinus rhythm rate of 67 normal axis and T wave inversions in III and F but no other changes ____________________________________________  RADIOLOGY  ED MD interpretation:  CT shows no acute changes  Official radiology report(s): Ct Head Wo Contrast  Result Date: 02/22/2017 CLINICAL DATA:  Focal neuro deficit greater than 6 hours. Left arm numbness EXAM: CT HEAD WITHOUT CONTRAST TECHNIQUE: Contiguous axial images were obtained from the base of the skull through the vertex without intravenous contrast. COMPARISON:  CT head 06/14/2011 FINDINGS: Brain: Mild atrophy. Negative for acute infarct, hemorrhage, mass. Mild chronic microvascular ischemic changes best seen on prior MRI. Vascular: Negative for vascular thrombosis Skull: Negative Sinuses/Orbits: Negative Other: None IMPRESSION: No acute abnormality.  Negative for age. Electronically Signed   By: Marlan Palauharles  Clark M.D.   On: 02/22/2017 20:05    ____________________________________________   PROCEDURES  Procedure(s) performed:   Procedures  Critical Care performed:  ____________________________________________   INITIAL IMPRESSION / ASSESSMENT AND PLAN / ED COURSE  patient had a stroke documented 2013 on MRI. It was a lacunar infarct. Patient had resolution of his deficits afterwards. In fact he thought he only had a TIA. He has had no problems since then. Since this is a new problem 6 years after the first and it is a TIA I will admit him for further workup.        ____________________________________________   FINAL CLINICAL IMPRESSION(S) / ED DIAGNOSES  Final diagnoses:  TIA (transient ischemic attack)     ED Discharge Orders    None       Note:  This document was prepared using Dragon voice recognition software and may include unintentional dictation errors.    Arnaldo NatalMalinda, Paul F,  MD 02/22/17 2302

## 2017-02-22 NOTE — ED Triage Notes (Signed)
Pt comes into the ED via POV c/o HTN for the past couple of days. Patient has h/o TIA and states that last night him left arm went numb after he woke up laying on the arm.  Patient is concerned his BP is uncontrolled at this time.  Denies any shortness of breath or dizziness but states that his chest feels "fluttery".  Patient has even and unlabored respirations at this time and in NAD with even and unlabored respirations.

## 2017-02-22 NOTE — ED Triage Notes (Signed)
FIRST NURSE NOTE-here for bp of 140s-150s systolic, doctor told pt to get checked at ED.  Pt denies symptoms but reports woke up at 4 am and left arm felt numb but that resolved and no longer has any symptoms.  Pulled triage next.

## 2017-02-23 ENCOUNTER — Observation Stay: Payer: Medicare HMO

## 2017-02-23 ENCOUNTER — Observation Stay
Admit: 2017-02-23 | Discharge: 2017-02-23 | Disposition: A | Discharge status: Home / Self Care | Payer: Medicare HMO | Attending: Internal Medicine | Admitting: Internal Medicine

## 2017-02-23 ENCOUNTER — Other Ambulatory Visit: Payer: Self-pay

## 2017-02-23 DIAGNOSIS — I10 Essential (primary) hypertension: Secondary | ICD-10-CM | POA: Diagnosis not present

## 2017-02-23 DIAGNOSIS — G459 Transient cerebral ischemic attack, unspecified: Secondary | ICD-10-CM

## 2017-02-23 DIAGNOSIS — R200 Anesthesia of skin: Secondary | ICD-10-CM | POA: Diagnosis not present

## 2017-02-23 DIAGNOSIS — E785 Hyperlipidemia, unspecified: Secondary | ICD-10-CM | POA: Diagnosis not present

## 2017-02-23 DIAGNOSIS — I6523 Occlusion and stenosis of bilateral carotid arteries: Secondary | ICD-10-CM | POA: Diagnosis not present

## 2017-02-23 DIAGNOSIS — I6389 Other cerebral infarction: Secondary | ICD-10-CM | POA: Diagnosis not present

## 2017-02-23 LAB — CBC
HCT: 39 % — ABNORMAL LOW (ref 40.0–52.0)
Hemoglobin: 13.5 g/dL (ref 13.0–18.0)
MCH: 32.5 pg (ref 26.0–34.0)
MCHC: 34.6 g/dL (ref 32.0–36.0)
MCV: 93.8 fL (ref 80.0–100.0)
PLATELETS: 165 10*3/uL (ref 150–440)
RBC: 4.15 MIL/uL — ABNORMAL LOW (ref 4.40–5.90)
RDW: 13.5 % (ref 11.5–14.5)
WBC: 5.4 10*3/uL (ref 3.8–10.6)

## 2017-02-23 LAB — LIPID PANEL
Cholesterol: 168 mg/dL (ref 0–200)
HDL: 35 mg/dL — ABNORMAL LOW (ref >40)
LDL Cholesterol: 94 mg/dL (ref 0–99)
Total CHOL/HDL Ratio: 4.8 RATIO
Triglycerides: 196 mg/dL — ABNORMAL HIGH (ref <150)
VLDL: 39 mg/dL (ref 0–40)

## 2017-02-23 LAB — ECHOCARDIOGRAM COMPLETE
HEIGHTINCHES: 65 in
WEIGHTICAEL: 2880 [oz_av]

## 2017-02-23 LAB — CREATININE, SERUM
CREATININE: 1.06 mg/dL (ref 0.61–1.24)
GFR calc Af Amer: >60 mL/min (ref >60)

## 2017-02-23 LAB — HEMOGLOBIN A1C
Hgb A1c MFr Bld: 5.5 % (ref 4.8–5.6)
MEAN PLASMA GLUCOSE: 111.15 mg/dL

## 2017-02-23 MED ORDER — ATORVASTATIN CALCIUM 40 MG PO TABS: 40.0000 mg | ORAL_TABLET | Freq: Every day | ORAL | 0 refills | Status: AC

## 2017-02-23 MED ORDER — ATORVASTATIN CALCIUM 20 MG PO TABS: 40.0000 mg | ORAL_TABLET | Freq: Every day | ORAL | Status: DC

## 2017-02-23 MED ORDER — CITALOPRAM HYDROBROMIDE 20 MG PO TABS
40.0000 mg | ORAL_TABLET | Freq: Every day | ORAL | Status: DC
  Filled 2017-02-23: qty 2

## 2017-02-23 MED ORDER — ACETAMINOPHEN 650 MG RE SUPP: 650.0000 mg | RECTAL | Status: DC | PRN

## 2017-02-23 MED ORDER — ACETAMINOPHEN 325 MG PO TABS: 650.0000 mg | ORAL_TABLET | ORAL | Status: DC | PRN

## 2017-02-23 MED ORDER — ACETAMINOPHEN 160 MG/5ML PO SOLN: 650.0000 mg | ORAL | Status: DC | PRN

## 2017-02-23 MED ORDER — CLOPIDOGREL BISULFATE 75 MG PO TABS
75.0000 mg | ORAL_TABLET | Freq: Every day | ORAL | Status: DC
  Administered 2017-02-23: 14:00:00 75 mg via ORAL
  Filled 2017-02-23: qty 1

## 2017-02-23 MED ORDER — ASPIRIN EC 81 MG PO TBEC: 81.0000 mg | DELAYED_RELEASE_TABLET | Freq: Every evening | ORAL | Status: DC

## 2017-02-23 MED ORDER — ENOXAPARIN SODIUM 40 MG/0.4ML ~~LOC~~ SOLN: 40.0000 mg | SUBCUTANEOUS | Status: DC

## 2017-02-23 MED ORDER — CLOPIDOGREL BISULFATE 75 MG PO TABS: 75.0000 mg | ORAL_TABLET | Freq: Every day | ORAL | 0 refills | Status: AC

## 2017-02-23 MED ORDER — STROKE: EARLY STAGES OF RECOVERY BOOK
Freq: Once | Status: AC
  Administered 2017-02-23: 03:00:00

## 2017-02-23 MED ORDER — PRAVASTATIN SODIUM 20 MG PO TABS: 10.0000 mg | ORAL_TABLET | Freq: Every day | ORAL | Status: DC

## 2017-02-23 NOTE — Consult Note (Addendum)
Referring Physician: Renae Gloss    Chief Complaint: Left hemiparesis  HPI: Aaron Hubbard is an 72 y.o. male with a history of stroke affecting the right side which completely resolved presenting with left-sided numbness and weakness.  Patient reports that he went to bed at baseline.  Awakened on yesterday noticing numbness on the left side of his body and inability to use his left arm.  Within minutes the symptoms resolved.  Patient presented for evaluation since this was similar to his previous presentation for stroke but on the other side.  Initial NIH stroke scale of 0  Date last known well: Date: 02/22/2017 Time last known well: Time: 23:00 tPA Given: No: Resolution of symptoms  Past Medical History:  Diagnosis Date  . Hypercholesteremia   . Hypertension   . Stroke (cerebrum) (HCC) 2013    Past Surgical History:  Procedure Laterality Date  . Photoselective vaporization of prostate      Family History  Problem Relation Age of Onset  . Hypertension Mother   . Cancer Brother   . Emphysema Father    Social History:  reports that he has quit smoking. he has never used smokeless tobacco. He reports that he drinks alcohol. He reports that he does not use drugs.  Allergies: No Known Allergies  Medications:  I have reviewed the patient's current medications. Prior to Admission:  Medications Prior to Admission  Medication Sig Dispense Refill Last Dose  . aspirin EC 81 MG tablet Take 1 tablet by mouth every evening.    02/21/2017 at Unknown  . calcium-vitamin D (CALCIUM 500/D) 500-200 MG-UNIT tablet Take 1 tablet by mouth 2 (two) times daily.   02/22/2017 at 1000  . citalopram (CELEXA) 40 MG tablet Take one tablet daily. In the evening   02/21/2017 at Unknown time  . lisinopril (PRINIVIL,ZESTRIL) 10 MG tablet TAKE 1 TABLET EVERY DAY   02/22/2017 at 1000  . meloxicam (MOBIC) 15 MG tablet TAKE 1 TABLET EVERY DAY   02/22/2017 at 1000  . Multiple Vitamins-Minerals (ICAPS AREDS 2) CAPS Take 1  capsule by mouth 2 (two) times daily.    02/22/2017 at 1000  . pravastatin (PRAVACHOL) 10 MG tablet TAKE 1 TABLET EVERY NIGHT   02/21/2017 at Unknown time  . tiZANidine (ZANAFLEX) 4 MG tablet TAKE 1 TABLET EVERY NIGHT AS NEEDED FOR MUSCLE SPASMS   02/21/2017 at Unknown time  . valACYclovir (VALTREX) 1000 MG tablet TAKE 2 TABLETS TWICE DAILY FOR 1 DAY. FOR ACUTE EPISODE OF HERPES LABIALIS AS NEEDED   prn at prn   Scheduled: . aspirin EC  81 mg Oral QPM  . citalopram  40 mg Oral Daily  . enoxaparin (LOVENOX) injection  40 mg Subcutaneous Q24H  . pravastatin  10 mg Oral q1800    ROS: History obtained from the patient  General ROS: negative for - chills, fatigue, fever, night sweats, weight gain or weight loss Psychological ROS: negative for - behavioral disorder, hallucinations, memory difficulties, mood swings or suicidal ideation Ophthalmic ROS: negative for - blurry vision, double vision, eye pain or loss of vision ENT ROS: negative for - epistaxis, nasal discharge, oral lesions, sore throat, tinnitus or vertigo Allergy and Immunology ROS: negative for - hives or itchy/watery eyes Hematological and Lymphatic ROS: negative for - bleeding problems, bruising or swollen lymph nodes Endocrine ROS: negative for - galactorrhea, hair pattern changes, polydipsia/polyuria or temperature intolerance Respiratory ROS: negative for - cough, hemoptysis, shortness of breath or wheezing Cardiovascular ROS: negative for - chest pain,  dyspnea on exertion, edema or irregular heartbeat Gastrointestinal ROS: negative for - abdominal pain, diarrhea, hematemesis, nausea/vomiting or stool incontinence Genito-Urinary ROS: negative for - dysuria, hematuria, incontinence or urinary frequency/urgency Musculoskeletal ROS: negative for - joint swelling or muscular weakness Neurological ROS: as noted in HPI Dermatological ROS: negative for rash and skin lesion changes  Physical Examination: Blood pressure (!) 145/73, pulse  62, temperature 97.8 F (36.6 C), temperature source Oral, resp. rate 16, height 5\' 5"  (1.651 m), weight 81.6 kg (180 lb), SpO2 94 %.  HEENT-  Normocephalic, no lesions, without obvious abnormality.  Normal external eye and conjunctiva.  Normal TM's bilaterally.  Normal auditory canals and external ears. Normal external nose, mucus membranes and septum.  Normal pharynx. Cardiovascular- S1, S2 normal, pulses palpable throughout   Lungs- chest clear, no wheezing, rales, normal symmetric air entry Abdomen- soft, non-tender; bowel sounds normal; no masses,  no organomegaly Extremities- Mild LE edema Lymph-no adenopathy palpable Musculoskeletal-no joint tenderness, deformity or swelling Skin-warm and dry, no hyperpigmentation, vitiligo, or suspicious lesions  Neurological Examination   Mental Status: Alert, oriented, thought content appropriate.  Speech fluent without evidence of aphasia.  Able to follow 3 step commands without difficulty. Cranial Nerves: II: Discs flat bilaterally; Visual fields grossly normal, pupils equal, round, reactive to light and accommodation III,IV, VI: ptosis not present, extra-ocular motions intact bilaterally V,VII: smile symmetric, facial light touch sensation normal bilaterally VIII: hearing normal bilaterally IX,X: gag reflex present XI: bilateral shoulder shrug XII: midline tongue extension Motor: Right : Upper extremity   5/5    Left:     Upper extremity   5/5  Lower extremity   5/5     Lower extremity   5/5 Tone and bulk:normal tone throughout; no atrophy noted Sensory: Pinprick and light touch intact throughout, bilaterally Deep Tendon Reflexes: 2+ and symmetric with absent AJ's bilaterally Plantars: Right: downgoing   Left: downgoing Cerebellar: Normal finger-to-nose and normal heel-to-shin testing bilaterally Gait: normal gait and station    Laboratory Studies:  Basic Metabolic Panel: Recent Labs  Lab 02/22/17 1807 02/23/17 0620  NA 140  --    K 4.0  --   CL 105  --   CO2 28  --   GLUCOSE 116*  --   BUN 25*  --   CREATININE 1.21 1.06  CALCIUM 9.1  --     Liver Function Tests: No results for input(s): AST, ALT, ALKPHOS, BILITOT, PROT, ALBUMIN in the last 168 hours. No results for input(s): LIPASE, AMYLASE in the last 168 hours. No results for input(s): AMMONIA in the last 168 hours.  CBC: Recent Labs  Lab 02/22/17 1807 02/23/17 0620  WBC 5.8 5.4  HGB 13.9 13.5  HCT 40.3 39.0*  MCV 93.5 93.8  PLT 186 165    Cardiac Enzymes: Recent Labs  Lab 02/22/17 1807  TROPONINI <0.03    .lrBNP: Invalid input(s): POCBNP  CBG: No results for input(s): GLUCAP in the last 168 hours.  Microbiology: No results found for this or any previous visit.  Coagulation Studies: No results for input(s): LABPROT, INR in the last 72 hours.  Urinalysis: No results for input(s): COLORURINE, LABSPEC, PHURINE, GLUCOSEU, HGBUR, BILIRUBINUR, KETONESUR, PROTEINUR, UROBILINOGEN, NITRITE, LEUKOCYTESUR in the last 168 hours.  Invalid input(s): APPERANCEUR  Lipid Panel:    Component Value Date/Time   CHOL 168 02/23/2017 0620   CHOL 213 (H) 06/15/2011 0437   TRIG 196 (H) 02/23/2017 0620   TRIG 189 06/15/2011 0437   HDL 35 (L)  02/23/2017 0620   HDL 31 (L) 06/15/2011 0437   CHOLHDL 4.8 02/23/2017 0620   VLDL 39 02/23/2017 0620   VLDL 38 06/15/2011 0437   LDLCALC 94 02/23/2017 0620   LDLCALC 144 (H) 06/15/2011 0437    HgbA1C: No results found for: HGBA1C  Urine Drug Screen:  No results found for: LABOPIA, COCAINSCRNUR, LABBENZ, AMPHETMU, THCU, LABBARB  Alcohol Level: No results for input(s): ETH in the last 168 hours.   Imaging: Ct Head Wo Contrast  Result Date: 02/22/2017 CLINICAL DATA:  Focal neuro deficit greater than 6 hours. Left arm numbness EXAM: CT HEAD WITHOUT CONTRAST TECHNIQUE: Contiguous axial images were obtained from the base of the skull through the vertex without intravenous contrast. COMPARISON:  CT head  06/14/2011 FINDINGS: Brain: Mild atrophy. Negative for acute infarct, hemorrhage, mass. Mild chronic microvascular ischemic changes best seen on prior MRI. Vascular: Negative for vascular thrombosis Skull: Negative Sinuses/Orbits: Negative Other: None IMPRESSION: No acute abnormality.  Negative for age. Electronically Signed   By: Marlan Palau M.D.   On: 02/22/2017 20:05   Mr Brain Wo Contrast  Result Date: 02/23/2017 CLINICAL DATA:  Initial evaluation for left-sided numbness. EXAM: MRI HEAD WITHOUT CONTRAST MRA HEAD WITHOUT CONTRAST TECHNIQUE: Multiplanar, multiecho pulse sequences of the brain and surrounding structures were obtained without intravenous contrast. Angiographic images of the head were obtained using MRA technique without contrast. COMPARISON:  Prior CT from 02/22/2017. FINDINGS: MRI HEAD FINDINGS Brain: Generalized age-related cerebral atrophy. Few scattered patchy T2/FLAIR hyperintensities within the periventricular, deep, and subcortical white matter both cerebral hemispheres, nonspecific, but most like related chronic small vessel ischemic disease, mild for age. Chronic microvascular changes present within the pons as well. Small remote lacunar infarct present within the left basal ganglia/corona radiata. No abnormal foci of restricted diffusion to suggest acute or subacute ischemia. Gray-white matter differentiation maintained. No other evidence for chronic infarction. No foci of susceptibility artifact to suggest acute or chronic intracranial hemorrhage. No mass lesion, midline shift or mass effect. No hydrocephalus. No extra-axial fluid collection. Major dural sinuses are grossly patent. Pituitary gland suprasellar region normal. Midline structures intact and normal. Vascular: Major intracranial vascular flow voids maintained. Skull and upper cervical spine: Craniocervical junction within normal limits. Visualized upper cervical spine unremarkable. Bone marrow signal intensity within  normal limits. No scalp soft tissue abnormality. Sinuses/Orbits: Globes and orbital soft tissues within normal limits. Patient status post lens extraction on the right. Paranasal sinuses are clear. No mastoid effusion. Inner ear structures grossly normal. Other: None. MRA HEAD FINDINGS ANTERIOR CIRCULATION: Petrous segments widely patent bilaterally. Mild atheromatous irregularity within the cavernous ICAs without flow-limiting stenosis. Supraclinoid segments patent bilaterally. ICA termini widely patent. A1 segments, anterior communicating artery, and anterior cerebral arteries widely patent to their distal aspects. M1 segments widely patent without stenosis or occlusion. Right M1 segment bifurcates early. No proximal M2 occlusion. Distal MCA branches well perfused and symmetric. POSTERIOR CIRCULATION: Vertebral arteries not included on this examination. Basilar artery widely patent to its distal aspect. Superior cerebellar and posterior cerebral arteries widely patent bilaterally. No aneurysm. IMPRESSION: MRI HEAD IMPRESSION: 1. No acute intracranial infarct or other abnormality identified. 2. Subcentimeter remote lacunar infarct involving the left basal ganglia/corona radiata. 3. 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 MRA HEAD IMPRESSION: Normal intracranial MRA. Electronically Signed   By: Rise Mu M.D.   On: 02/23/2017 06:39   US Carotid Bilateral (at [facility] And [facility] Only)  Result Date: 02/23/2017 CLINICAL DATA:  Left-sided weakness since 4  o'clock this morning. History of hypertension and hyperlipidemia. EXAM: BILATERAL CAROTID DUPLEX ULTRASOUND TECHNIQUE: Wallace Cullens scale imaging, color Doppler and duplex ultrasound were performed of bilateral carotid and vertebral arteries in the neck. COMPARISON:  None. FINDINGS: Criteria: Quantification of carotid stenosis is based on velocity parameters that correlate the residual internal carotid diameter with NASCET-based stenosis levels, using the  diameter of the distal internal carotid lumen as the denominator for stenosis measurement. The following velocity measurements were obtained: RIGHT ICA:  75/19 cm/sec CCA:  117/16 cm/sec SYSTOLIC ICA/CCA RATIO:  0.6 DIASTOLIC ICA/CCA RATIO:  1.2 ECA:  137 cm/sec LEFT ICA:  82/19 cm/sec CCA:  112/12 cm/sec SYSTOLIC ICA/CCA RATIO:  0.7 DIASTOLIC ICA/CCA RATIO:  1.6 ECA:  120 cm/sec RIGHT CAROTID ARTERY: There is a minimal amount of mixed echogenic plaque involving the origin and proximal aspects of the right internal carotid artery (image 24), not resulting in elevated peak systolic velocities within the interrogated course the right internal carotid artery to suggest a hemodynamically significant stenosis. RIGHT VERTEBRAL ARTERY:  Antegrade flow LEFT CAROTID ARTERY: There is a minimal amount of intimal thickening/atherosclerotic plaque involving the origin and proximal aspects of the left internal carotid artery (image 55), not resulting in elevated peak systolic velocities within the interrogated course the left internal carotid artery to suggest a hemodynamically significant stenosis. LEFT VERTEBRAL ARTERY:  Antegrade Flow IMPRESSION: Minimal amount of bilateral atherosclerotic plaque, right subjectively greater than left, not resulting in a hemodynamically significant stenosis within either internal carotid artery. Electronically Signed   By: Simonne Come M.D.   On: 02/23/2017 10:00   Mr Maxine Glenn Head/brain ZO Cm  Result Date: 02/23/2017 CLINICAL DATA:  Initial evaluation for left-sided numbness. EXAM: MRI HEAD WITHOUT CONTRAST MRA HEAD WITHOUT CONTRAST TECHNIQUE: Multiplanar, multiecho pulse sequences of the brain and surrounding structures were obtained without intravenous contrast. Angiographic images of the head were obtained using MRA technique without contrast. COMPARISON:  Prior CT from 02/22/2017. FINDINGS: MRI HEAD FINDINGS Brain: Generalized age-related cerebral atrophy. Few scattered patchy T2/FLAIR  hyperintensities within the periventricular, deep, and subcortical white matter both cerebral hemispheres, nonspecific, but most like related chronic small vessel ischemic disease, mild for age. Chronic microvascular changes present within the pons as well. Small remote lacunar infarct present within the left basal ganglia/corona radiata. No abnormal foci of restricted diffusion to suggest acute or subacute ischemia. Gray-white matter differentiation maintained. No other evidence for chronic infarction. No foci of susceptibility artifact to suggest acute or chronic intracranial hemorrhage. No mass lesion, midline shift or mass effect. No hydrocephalus. No extra-axial fluid collection. Major dural sinuses are grossly patent. Pituitary gland suprasellar region normal. Midline structures intact and normal. Vascular: Major intracranial vascular flow voids maintained. Skull and upper cervical spine: Craniocervical junction within normal limits. Visualized upper cervical spine unremarkable. Bone marrow signal intensity within normal limits. No scalp soft tissue abnormality. Sinuses/Orbits: Globes and orbital soft tissues within normal limits. Patient status post lens extraction on the right. Paranasal sinuses are clear. No mastoid effusion. Inner ear structures grossly normal. Other: None. MRA HEAD FINDINGS ANTERIOR CIRCULATION: Petrous segments widely patent bilaterally. Mild atheromatous irregularity within the cavernous ICAs without flow-limiting stenosis. Supraclinoid segments patent bilaterally. ICA termini widely patent. A1 segments, anterior communicating artery, and anterior cerebral arteries widely patent to their distal aspects. M1 segments widely patent without stenosis or occlusion. Right M1 segment bifurcates early. No proximal M2 occlusion. Distal MCA branches well perfused and symmetric. POSTERIOR CIRCULATION: Vertebral arteries not included on this examination. Basilar artery  widely patent to its distal  aspect. Superior cerebellar and posterior cerebral arteries widely patent bilaterally. No aneurysm. IMPRESSION: MRI HEAD IMPRESSION: 1. No acute intracranial infarct or other abnormality identified. 2. Subcentimeter remote lacunar infarct involving the left basal ganglia/corona radiata. 3. 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 MRA HEAD IMPRESSION: Normal intracranial MRA. Electronically Signed   By: Rise MuBenjamin  McClintock M.D.   On: 02/23/2017 06:39    Assessment: 72 y.o. male with a history of stroke presenting with complaints of left hemiparesis that resolved.  MRI of the brain reviewed and shows no acute changes.  Suspect TIA.  Patient on aspirin at home.  Carotid dopplers show no evidence of hemodynamically significant stenosis.  Echocardiogram pending.  A1c pending, LDL 94.  Stroke Risk Factors - hyperlipidemia and hypertension  Plan: 1. PT consult, OT consult, Speech consult 2. Echocardiogram pending.  If unremarkable patient be evaluated on outpatient basis for hold on cardiac monitoring. 3. Prophylactic therapy-aspirin 81 mg and Plavix 75 mg daily 4. NPO until RN stroke swallow screen 5. Telemetry monitoring 6. Frequent neuro checks 7. Statin for lipid management with target LDL<70.    Thana FarrLeslie Ronin Crager, MD Neurology [phone] 02/23/2017, 11:47 AM

## 2017-02-23 NOTE — Progress Notes (Signed)
SLP Cancellation Note  Patient Details Name: Aaron Hubbard MRN: [id number] DOB: 1945/11/30   Cancelled treatment:       Reason Eval/Treat Not Completed: SLP screened, no needs identified, will sign off(chart reviewed; consulted NSG then met w/ pt). Pt denied any difficulty swallowing and is currently on a regular diet; tolerates swallowing pills w/ water per NSG. Pt conversed at conversational level w/out deficits noted; pt denied any speech-language deficits.  No further skilled ST services indicated as pt appears at his baseline. Pt agreed. NSG to reconsult if any change in status.     Orinda Kenner, MS, CCC-SLP Watson,Katherine 02/23/2017, 9:38 AM

## 2017-02-23 NOTE — Progress Notes (Signed)
OT Cancellation Note  Patient Details Name: Aaron Hubbard MRN: [id number] DOB: 06-01-1945   Cancelled Treatment:    Reason Eval/Treat Not Completed: OT screened, no needs identified, will sign off. Order received, chart reviewed. Pt has returned to baseline functional independence and all deficits have resolved, per pt report. No deficits noted with sensation, strength, coordination, or vision. No skilled OT needs identified. Will sign off. Please re-consult if additional needs arise.   [name], MPH, MS, OTR/L ascom [phone] 02/23/17, 10:47 AM

## 2017-02-23 NOTE — Progress Notes (Signed)
*  PRELIMINARY RESULTS* Echocardiogram 2D Echocardiogram has been performed.  Aaron GulaJoan M Saleen Peden 02/23/2017, 10:36 AM

## 2017-02-23 NOTE — Discharge Summary (Signed)
[facility] - [place] [facility]   PATIENT NAME: Aaron Hubbard    MR#:  [id number]  DATE OF BIRTH:  [birth year]  DATE OF ADMISSION:  02/22/2017 ADMITTING PHYSICIAN: Oralia Manis, MD  DATE OF DISCHARGE: 02/23/2017  3:48 PM  PRIMARY CARE PHYSICIAN: Dr Leotis Shames    ADMISSION DIAGNOSIS:  TIA (transient ischemic attack) [G45.9]  DISCHARGE DIAGNOSIS:  Principal Problem:   Left sided numbness Active Problems:   HTN (hypertension)   HLD (hyperlipidemia)   Depression   SECONDARY DIAGNOSIS:   Past Medical History:  Diagnosis Date  . Hypercholesteremia   . Hypertension   . 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  Transient ischemic attack.  Left hand numbness and weakness.  Symptoms had resolved.  MRI of the brain was negative for acute stroke.  The patient did have a remote left basal ganglia infarct in the past.  Echocardiogram showed no signs of clot in the heart.  Carotid ultrasound negative.  Telemetry monitoring unremarkable.  Patient will need an event monitor or Holter monitor or loop recorder as outpatient.  I will refer to cardiology as outpatient.  Plavix added to his aspirin.  Patient seen in consultation by neurology.  Follow-up 2.  Hyperlipidemia unspecified.  LDL 94.  Change his statin over to high intensity Lipitor 40 mg daily.  Goal LDL less than 70. 3.  Essential hypertension.  Blood pressure little bit elevated while he is here.  163/67 upon discharge home.  With TIA allow permissive hypertension.  Usual blood pressure medications okay to go back on.  Follow-up for blood pressure check 1 week with medical doctor.  DISCHARGE CONDITIONS:   Satisfactory  CONSULTS OBTAINED:  Treatment Team:  Thana Farr, MD  DRUG ALLERGIES:  No Known Allergies  DISCHARGE MEDICATIONS:   Allergies as of 02/23/2017   No Known Allergies     Medication List    STOP taking these medications   pravastatin 10 MG tablet Commonly known as:   PRAVACHOL     TAKE these medications   aspirin EC 81 MG tablet Take 1 tablet by mouth every evening.   atorvastatin 40 MG tablet Commonly known as:  LIPITOR Take 1 tablet (40 mg total) by mouth daily at 6 PM.   CALCIUM 500/D 500-200 MG-UNIT tablet Generic drug:  calcium-vitamin D Take 1 tablet by mouth 2 (two) times daily.   citalopram 40 MG tablet Commonly known as:  CELEXA Take one tablet daily. In the evening   clopidogrel 75 MG tablet Commonly known as:  PLAVIX Take 1 tablet (75 mg total) by mouth daily.   ICAPS AREDS 2 Caps Take 1 capsule by mouth 2 (two) times daily.   lisinopril 10 MG tablet Commonly known as:  PRINIVIL,ZESTRIL TAKE 1 TABLET EVERY DAY   meloxicam 15 MG tablet Commonly known as:  MOBIC TAKE 1 TABLET EVERY DAY   tiZANidine 4 MG tablet Commonly known as:  ZANAFLEX TAKE 1 TABLET EVERY NIGHT AS NEEDED FOR MUSCLE SPASMS   valACYclovir 1000 MG tablet Commonly known as:  VALTREX TAKE 2 TABLETS TWICE DAILY FOR 1 DAY. FOR ACUTE EPISODE OF HERPES LABIALIS AS NEEDED        DISCHARGE INSTRUCTIONS:  Follow-up PMD 1 week Follow-up cardiology 1 week  If you experience worsening of your admission symptoms, develop shortness of breath, life threatening emergency, suicidal or homicidal thoughts you must seek medical attention immediately by calling 911 or calling your MD immediately  if  symptoms less severe.  You Must read complete instructions/literature along with all the possible adverse reactions/side effects for all the Medicines you take and that have been prescribed to you. Take any new Medicines after you have completely understood and accept all the possible adverse reactions/side effects.   Please note  You were cared for by a hospitalist during your hospital stay. If you have any questions about your discharge medications or the care you received while you were in the hospital after you are discharged, you can call the unit and asked to speak  with the hospitalist on call if the hospitalist that took care of you is not available. Once you are discharged, your primary care physician will handle any further medical issues. Please note that NO REFILLS for any discharge medications will be authorized once you are discharged, as it is imperative that you return to your primary care physician (or establish a relationship with a primary care physician if you do not have one) for your aftercare needs so that they can reassess your need for medications and monitor your lab values.    Today   CHIEF COMPLAINT:   Chief Complaint  Patient presents with  . Hypertension    HISTORY OF PRESENT ILLNESS:  Aaron Hubbard  is a 72 y.o. male presented with left hand weakness and numbness   VITAL SIGNS:  Blood pressure (!) 163/67, pulse 64, temperature 98 F (36.7 C), temperature source Oral, resp. rate 20, height 5\' 5"  (1.651 m), weight 81.6 kg (180 lb), SpO2 96 %.    PHYSICAL EXAMINATION:  GENERAL:  72 y.o.-year-old patient lying in the bed with no acute distress.  EYES: Pupils equal, round, reactive to light and accommodation. No scleral icterus. Extraocular muscles intact.  HEENT: Head atraumatic, normocephalic. Oropharynx and nasopharynx clear.  NECK:  Supple, no jugular venous distention. No thyroid enlargement, no tenderness.  LUNGS: Normal breath sounds bilaterally, no wheezing, rales,rhonchi or crepitation. No use of accessory muscles of respiration.  CARDIOVASCULAR: S1, S2 normal. No murmurs, rubs, or gallops.  ABDOMEN: Soft, non-tender, non-distended. Bowel sounds present. No organomegaly or mass.  EXTREMITIES: No pedal edema, cyanosis, or clubbing.  NEUROLOGIC: Cranial nerves II through XII are intact. Muscle strength 5/5 in all extremities. Sensation intact. Gait not checked.  PSYCHIATRIC: The patient is alert and oriented x 3.  SKIN: No obvious rash, lesion, or ulcer.   DATA REVIEW:   CBC Recent Labs  Lab 02/23/17 0620   WBC 5.4  HGB 13.5  HCT 39.0*  PLT 165    Chemistries  Recent Labs  Lab 02/22/17 1807 02/23/17 0620  NA 140  --   K 4.0  --   CL 105  --   CO2 28  --   GLUCOSE 116*  --   BUN 25*  --   CREATININE 1.21 1.06  CALCIUM 9.1  --     Cardiac Enzymes Recent Labs  Lab 02/22/17 1807  TROPONINI <0.03     RADIOLOGY:  Ct Head Wo Contrast  Result Date: 02/22/2017 CLINICAL DATA:  Focal neuro deficit greater than 6 hours. Left arm numbness EXAM: CT HEAD WITHOUT CONTRAST TECHNIQUE: Contiguous axial images were obtained from the base of the skull through the vertex without intravenous contrast. COMPARISON:  CT head 06/14/2011 FINDINGS: Brain: Mild atrophy. Negative for acute infarct, hemorrhage, mass. Mild chronic microvascular ischemic changes best seen on prior MRI. Vascular: Negative for vascular thrombosis Skull: Negative Sinuses/Orbits: Negative Other: None IMPRESSION: No acute abnormality.  Negative for  age. Electronically Signed   By: Marlan Palau M.D.   On: 02/22/2017 20:05   Mr Brain Wo Contrast  Result Date: 02/23/2017 CLINICAL DATA:  Initial evaluation for left-sided numbness. EXAM: MRI HEAD WITHOUT CONTRAST MRA HEAD WITHOUT CONTRAST TECHNIQUE: Multiplanar, multiecho pulse sequences of the brain and surrounding structures were obtained without intravenous contrast. Angiographic images of the head were obtained using MRA technique without contrast. COMPARISON:  Prior CT from 02/22/2017. FINDINGS: MRI HEAD FINDINGS Brain: Generalized age-related cerebral atrophy. Few scattered patchy T2/FLAIR hyperintensities within the periventricular, deep, and subcortical white matter both cerebral hemispheres, nonspecific, but most like related chronic small vessel ischemic disease, mild for age. Chronic microvascular changes present within the pons as well. Small remote lacunar infarct present within the left basal ganglia/corona radiata. No abnormal foci of restricted diffusion to suggest acute or  subacute ischemia. Gray-white matter differentiation maintained. No other evidence for chronic infarction. No foci of susceptibility artifact to suggest acute or chronic intracranial hemorrhage. No mass lesion, midline shift or mass effect. No hydrocephalus. No extra-axial fluid collection. Major dural sinuses are grossly patent. Pituitary gland suprasellar region normal. Midline structures intact and normal. Vascular: Major intracranial vascular flow voids maintained. Skull and upper cervical spine: Craniocervical junction within normal limits. Visualized upper cervical spine unremarkable. Bone marrow signal intensity within normal limits. No scalp soft tissue abnormality. Sinuses/Orbits: Globes and orbital soft tissues within normal limits. Patient status post lens extraction on the right. Paranasal sinuses are clear. No mastoid effusion. Inner ear structures grossly normal. Other: None. MRA HEAD FINDINGS ANTERIOR CIRCULATION: Petrous segments widely patent bilaterally. Mild atheromatous irregularity within the cavernous ICAs without flow-limiting stenosis. Supraclinoid segments patent bilaterally. ICA termini widely patent. A1 segments, anterior communicating artery, and anterior cerebral arteries widely patent to their distal aspects. M1 segments widely patent without stenosis or occlusion. Right M1 segment bifurcates early. No proximal M2 occlusion. Distal MCA branches well perfused and symmetric. POSTERIOR CIRCULATION: Vertebral arteries not included on this examination. Basilar artery widely patent to its distal aspect. Superior cerebellar and posterior cerebral arteries widely patent bilaterally. No aneurysm. IMPRESSION: MRI HEAD IMPRESSION: 1. No acute intracranial infarct or other abnormality identified. 2. Subcentimeter remote lacunar infarct involving the left basal ganglia/corona radiata. 3. 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 MRA HEAD IMPRESSION: Normal intracranial MRA. Electronically  Signed   By: Rise Mu M.D.   On: 02/23/2017 06:39   US Carotid Bilateral (at [facility] Only)  Result Date: 02/23/2017 CLINICAL DATA:  Left-sided weakness since 4 o'clock this morning. History of hypertension and hyperlipidemia. EXAM: BILATERAL CAROTID DUPLEX ULTRASOUND TECHNIQUE: Wallace Cullens scale imaging, color Doppler and duplex ultrasound were performed of bilateral carotid and vertebral arteries in the neck. COMPARISON:  None. FINDINGS: Criteria: Quantification of carotid stenosis is based on velocity parameters that correlate the residual internal carotid diameter with NASCET-based stenosis levels, using the diameter of the distal internal carotid lumen as the denominator for stenosis measurement. The following velocity measurements were obtained: RIGHT ICA:  75/19 cm/sec CCA:  117/16 cm/sec SYSTOLIC ICA/CCA RATIO:  0.6 DIASTOLIC ICA/CCA RATIO:  1.2 ECA:  137 cm/sec LEFT ICA:  82/19 cm/sec CCA:  112/12 cm/sec SYSTOLIC ICA/CCA RATIO:  0.7 DIASTOLIC ICA/CCA RATIO:  1.6 ECA:  120 cm/sec RIGHT CAROTID ARTERY: There is a minimal amount of mixed echogenic plaque involving the origin and proximal aspects of the right internal carotid artery (image 24), not resulting in elevated peak systolic velocities within the interrogated course the right internal carotid artery  to suggest a hemodynamically significant stenosis. RIGHT VERTEBRAL ARTERY:  Antegrade flow LEFT CAROTID ARTERY: There is a minimal amount of intimal thickening/atherosclerotic plaque involving the origin and proximal aspects of the left internal carotid artery (image 55), not resulting in elevated peak systolic velocities within the interrogated course the left internal carotid artery to suggest a hemodynamically significant stenosis. LEFT VERTEBRAL ARTERY:  Antegrade Flow IMPRESSION: Minimal amount of bilateral atherosclerotic plaque, right subjectively greater than left, not resulting in a hemodynamically significant stenosis within either  internal carotid artery. Electronically Signed   By: Simonne ComeJohn  Watts M.D.   On: 02/23/2017 10:00   Mr Maxine GlennMra Head/brain WGWo Cm  Result Date: 02/23/2017 CLINICAL DATA:  Initial evaluation for left-sided numbness. EXAM: MRI HEAD WITHOUT CONTRAST MRA HEAD WITHOUT CONTRAST TECHNIQUE: Multiplanar, multiecho pulse sequences of the brain and surrounding structures were obtained without intravenous contrast. Angiographic images of the head were obtained using MRA technique without contrast. COMPARISON:  Prior CT from 02/22/2017. FINDINGS: MRI HEAD FINDINGS Brain: Generalized age-related cerebral atrophy. Few scattered patchy T2/FLAIR hyperintensities within the periventricular, deep, and subcortical white matter both cerebral hemispheres, nonspecific, but most like related chronic small vessel ischemic disease, mild for age. Chronic microvascular changes present within the pons as well. Small remote lacunar infarct present within the left basal ganglia/corona radiata. No abnormal foci of restricted diffusion to suggest acute or subacute ischemia. Gray-white matter differentiation maintained. No other evidence for chronic infarction. No foci of susceptibility artifact to suggest acute or chronic intracranial hemorrhage. No mass lesion, midline shift or mass effect. No hydrocephalus. No extra-axial fluid collection. Major dural sinuses are grossly patent. Pituitary gland suprasellar region normal. Midline structures intact and normal. Vascular: Major intracranial vascular flow voids maintained. Skull and upper cervical spine: Craniocervical junction within normal limits. Visualized upper cervical spine unremarkable. Bone marrow signal intensity within normal limits. No scalp soft tissue abnormality. Sinuses/Orbits: Globes and orbital soft tissues within normal limits. Patient status post lens extraction on the right. Paranasal sinuses are clear. No mastoid effusion. Inner ear structures grossly normal. Other: None. MRA HEAD  FINDINGS ANTERIOR CIRCULATION: Petrous segments widely patent bilaterally. Mild atheromatous irregularity within the cavernous ICAs without flow-limiting stenosis. Supraclinoid segments patent bilaterally. ICA termini widely patent. A1 segments, anterior communicating artery, and anterior cerebral arteries widely patent to their distal aspects. M1 segments widely patent without stenosis or occlusion. Right M1 segment bifurcates early. No proximal M2 occlusion. Distal MCA branches well perfused and symmetric. POSTERIOR CIRCULATION: Vertebral arteries not included on this examination. Basilar artery widely patent to its distal aspect. Superior cerebellar and posterior cerebral arteries widely patent bilaterally. No aneurysm. IMPRESSION: MRI HEAD IMPRESSION: 1. No acute intracranial infarct or other abnormality identified. 2. Subcentimeter remote lacunar infarct involving the left basal ganglia/corona radiata. 3. 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 MRA HEAD IMPRESSION: Normal intracranial MRA. Electronically Signed   By: Rise MuBenjamin  McClintock M.D.   On: 02/23/2017 06:39      Management plans discussed with the patient, family and they are in agreement.  CODE STATUS:     Code Status Orders  (From admission, onward)        Start     Ordered   02/23/17 0250  Full code  Continuous     02/23/17 0249    Code Status History    Date Active Date Inactive Code Status Order ID Comments User Context   This patient has a current code status but no historical code status.  TOTAL TIME TAKING CARE OF THIS PATIENT: 35 minutes.    Alford Highland M.D on 02/23/2017 at 5:07 PM  Between 7am to 6pm - Pager - [phone]  After 6pm go to [email or web address] - password [email or web address]  [facility] Office  [phone]  CC: Primary care physician; Dr Leotis Shames

## 2017-02-23 NOTE — Care Management Obs Status (Signed)
MEDICARE OBSERVATION STATUS NOTIFICATION   Patient Details  Name: Aaron SalisburyRichard G Gervin MRN: [id number] Date of Birth: May 03, 1945   Medicare Observation Status Notification Given:  Yes    Gwenette GreetBrenda S Geary Rufo, RN 02/23/2017, 8:40 AM

## 2017-02-23 NOTE — Progress Notes (Signed)
Back from ultrasound

## 2017-02-23 NOTE — Progress Notes (Signed)
Pt discharged to home via wc.  Instructions and rx given to pt.  Questions answered.  No distress.  

## 2017-02-23 NOTE — Plan of Care (Signed)
  Progressing Education: Knowledge of disease or condition will improve 02/23/2017 1055 - Progressing by Donald ProseBerry, Dezmen Alcock L, RN Note Stroke Mapping booklet given to patient Self-Care: Ability to participate in self-care as condition permits will improve 02/23/2017 1055 - Progressing by Donald ProseBerry, Shelene Krage L, RN Note No deficits noted, able to care for self Clinical Measurements: Will remain free from infection 02/23/2017 1055 - Progressing by Donald ProseBerry, Temesgen Weightman L, RN Note Remains afebrile Pain Managment: General experience of comfort will improve 02/23/2017 1055 - Progressing by Donald ProseBerry, Meira Wahba L, RN Note PRN medications Safety: Ability to remain free from injury will improve 02/23/2017 1055 - Progressing by Donald ProseBerry, Sarahgrace Broman L, RN Note Fall precautions in place, non skid socks when oob

## 2017-02-23 NOTE — ED Notes (Signed)
Floor unable to take report at this time.

## 2017-02-23 NOTE — Progress Notes (Signed)
OT Cancellation Note  Patient Details Name: Aaron SalisburyRichard G Gunnerson MRN: [id number] DOB: 1945-04-02   Cancelled Treatment:    Reason Eval/Treat Not Completed: Patient at procedure or test/ unavailable. Order received, chart reviewed. Pt out of room for ultrasound. Will re-attempt OT evaluation at later date/time as pt is available and medically appropriate.   [name], MPH, MS, OTR/L ascom [phone] 02/23/17, 9:08 AM

## 2017-02-23 NOTE — Progress Notes (Signed)
To ultrasound via bed 

## 2017-02-23 NOTE — Evaluation (Signed)
Physical Therapy Evaluation Patient Details Name: Aaron SalisburyRichard G Hubbard MRN: [id number] DOB: 1945/02/12 Today's Date: 02/23/2017   History of Present Illness  72 y/o male here with transient L sided weakness that is resolved at time of PT exam.  Clinical Impression  Pt did well with PT with no residual symptoms, weakness, safety issues, etc.  He had good strength that was equal bilaterally in U&LEs and generally reports feeling back to baseline and confident to go home.  PT concurs, no further needs.     Follow Up Recommendations No PT follow up    Equipment Recommendations       Recommendations for Other Services       Precautions / Restrictions Precautions Precautions: None Restrictions Weight Bearing Restrictions: No      Mobility  Bed Mobility Overal bed mobility: Independent                Transfers Overall transfer level: Independent                  Ambulation/Gait Ambulation/Gait assistance: Independent Ambulation Distance (Feet): 250 Feet Assistive device: None       General Gait Details: Pt walks with good confidence, consistent speed/cadence and reports being near his baseline.  No LOBs or other safety issues.   Stairs            Wheelchair Mobility    Modified Rankin (Stroke Patients Only)       Balance Overall balance assessment: Independent                                           Pertinent Vitals/Pain Pain Assessment: No/denies pain    Home Living Family/patient expects to be discharged to:: Private residence Living Arrangements: Alone     Home Access: Stairs to enter Entrance Stairs-Rails: Left Entrance Stairs-Number of Steps: 3          Prior Function Level of Independence: Independent               Hand Dominance        Extremity/Trunk Assessment   Upper Extremity Assessment Upper Extremity Assessment: Overall WFL for tasks assessed    Lower Extremity Assessment Lower  Extremity Assessment: Overall WFL for tasks assessed       Communication   Communication: No difficulties  Cognition Arousal/Alertness: Awake/alert Behavior During Therapy: WFL for tasks assessed/performed Overall Cognitive Status: Within Functional Limits for tasks assessed                                        General Comments      Exercises     Assessment/Plan    PT Assessment Patent does not need any further PT services  PT Problem List         PT Treatment Interventions      PT Goals (Current goals can be found in the Care Plan section)  Acute Rehab PT Goals Patient Stated Goal: go home PT Goal Formulation: All assessment and education complete, DC therapy    Frequency     Barriers to discharge        Co-evaluation               AM-PAC PT "6 Clicks" Daily Activity  Outcome Measure Difficulty turning over in bed (  including adjusting bedclothes, sheets and blankets)?: None Difficulty moving from lying on back to sitting on the side of the bed? : None Difficulty sitting down on and standing up from a chair with arms (e.g., wheelchair, bedside commode, etc,.)?: None Help needed moving to and from a bed to chair (including a wheelchair)?: None Help needed walking in hospital room?: None Help needed climbing 3-5 steps with a railing? : None 6 Click Score: 24    End of Session Equipment Utilized During Treatment: Gait belt Activity Tolerance: Patient tolerated treatment well Patient left: with call bell/phone within reach;in bed   PT Visit Diagnosis: Muscle weakness (generalized) (M62.81)    Time: 1610-9604 PT Time Calculation (min) (ACUTE ONLY): 14 min   Charges:   PT Evaluation $PT Eval Low Complexity: 1 Low     PT G Codes:        Malachi Pro, DPT 02/23/2017, 11:14 AM

## 2017-03-06 DIAGNOSIS — I10 Essential (primary) hypertension: Secondary | ICD-10-CM | POA: Diagnosis not present

## 2017-03-06 DIAGNOSIS — F325 Major depressive disorder, single episode, in full remission: Secondary | ICD-10-CM | POA: Diagnosis not present

## 2017-03-06 DIAGNOSIS — Z8673 Personal history of transient ischemic attack (TIA), and cerebral infarction without residual deficits: Secondary | ICD-10-CM | POA: Diagnosis not present

## 2017-03-06 DIAGNOSIS — E7800 Pure hypercholesterolemia, unspecified: Secondary | ICD-10-CM | POA: Diagnosis not present

## 2017-03-09 DIAGNOSIS — G4733 Obstructive sleep apnea (adult) (pediatric): Secondary | ICD-10-CM | POA: Diagnosis not present

## 2017-03-09 DIAGNOSIS — E782 Mixed hyperlipidemia: Secondary | ICD-10-CM | POA: Diagnosis not present

## 2017-03-09 DIAGNOSIS — E7800 Pure hypercholesterolemia, unspecified: Secondary | ICD-10-CM | POA: Diagnosis not present

## 2017-03-09 DIAGNOSIS — I10 Essential (primary) hypertension: Secondary | ICD-10-CM | POA: Diagnosis not present

## 2017-03-09 DIAGNOSIS — I4891 Unspecified atrial fibrillation: Secondary | ICD-10-CM | POA: Diagnosis not present

## 2017-04-03 DIAGNOSIS — I10 Essential (primary) hypertension: Secondary | ICD-10-CM | POA: Diagnosis not present

## 2017-04-03 DIAGNOSIS — Z8673 Personal history of transient ischemic attack (TIA), and cerebral infarction without residual deficits: Secondary | ICD-10-CM | POA: Diagnosis not present

## 2017-04-03 DIAGNOSIS — E7800 Pure hypercholesterolemia, unspecified: Secondary | ICD-10-CM | POA: Diagnosis not present

## 2017-04-05 DIAGNOSIS — I10 Essential (primary) hypertension: Secondary | ICD-10-CM | POA: Diagnosis not present

## 2017-04-05 DIAGNOSIS — E7800 Pure hypercholesterolemia, unspecified: Secondary | ICD-10-CM | POA: Diagnosis not present

## 2017-04-05 DIAGNOSIS — K644 Residual hemorrhoidal skin tags: Secondary | ICD-10-CM | POA: Diagnosis not present

## 2017-05-24 DIAGNOSIS — H353134 Nonexudative age-related macular degeneration, bilateral, advanced atrophic with subfoveal involvement: Secondary | ICD-10-CM | POA: Diagnosis not present

## 2017-05-24 DIAGNOSIS — H2512 Age-related nuclear cataract, left eye: Secondary | ICD-10-CM | POA: Diagnosis not present

## 2017-05-24 DIAGNOSIS — H43821 Vitreomacular adhesion, right eye: Secondary | ICD-10-CM | POA: Diagnosis not present

## 2017-05-24 DIAGNOSIS — H35423 Microcystoid degeneration of retina, bilateral: Secondary | ICD-10-CM | POA: Diagnosis not present

## 2017-06-15 DIAGNOSIS — E7800 Pure hypercholesterolemia, unspecified: Secondary | ICD-10-CM | POA: Diagnosis not present

## 2017-06-15 DIAGNOSIS — I10 Essential (primary) hypertension: Secondary | ICD-10-CM | POA: Diagnosis not present

## 2017-06-21 DIAGNOSIS — F325 Major depressive disorder, single episode, in full remission: Secondary | ICD-10-CM | POA: Diagnosis not present

## 2017-06-21 DIAGNOSIS — I10 Essential (primary) hypertension: Secondary | ICD-10-CM | POA: Diagnosis not present

## 2017-06-21 DIAGNOSIS — E875 Hyperkalemia: Secondary | ICD-10-CM | POA: Diagnosis not present

## 2017-09-20 DIAGNOSIS — E875 Hyperkalemia: Secondary | ICD-10-CM | POA: Diagnosis not present

## 2017-09-27 DIAGNOSIS — I10 Essential (primary) hypertension: Secondary | ICD-10-CM | POA: Diagnosis not present

## 2017-09-27 DIAGNOSIS — E7800 Pure hypercholesterolemia, unspecified: Secondary | ICD-10-CM | POA: Diagnosis not present

## 2017-09-27 DIAGNOSIS — Z8673 Personal history of transient ischemic attack (TIA), and cerebral infarction without residual deficits: Secondary | ICD-10-CM | POA: Diagnosis not present

## 2017-09-27 DIAGNOSIS — E875 Hyperkalemia: Secondary | ICD-10-CM | POA: Diagnosis not present

## 2017-10-06 DIAGNOSIS — H353131 Nonexudative age-related macular degeneration, bilateral, early dry stage: Secondary | ICD-10-CM | POA: Diagnosis not present

## 2017-12-20 DIAGNOSIS — E875 Hyperkalemia: Secondary | ICD-10-CM | POA: Diagnosis not present

## 2017-12-20 DIAGNOSIS — I10 Essential (primary) hypertension: Secondary | ICD-10-CM | POA: Diagnosis not present

## 2017-12-20 DIAGNOSIS — E7800 Pure hypercholesterolemia, unspecified: Secondary | ICD-10-CM | POA: Diagnosis not present

## 2017-12-27 DIAGNOSIS — Z125 Encounter for screening for malignant neoplasm of prostate: Secondary | ICD-10-CM | POA: Diagnosis not present

## 2017-12-27 DIAGNOSIS — I10 Essential (primary) hypertension: Secondary | ICD-10-CM | POA: Diagnosis not present

## 2017-12-27 DIAGNOSIS — Z0000 Encounter for general adult medical examination without abnormal findings: Secondary | ICD-10-CM | POA: Diagnosis not present

## 2017-12-27 DIAGNOSIS — Z23 Encounter for immunization: Secondary | ICD-10-CM | POA: Diagnosis not present

## 2018-02-11 IMAGING — MR MR MRA HEAD W/O CM
9 of 11 series · 32 of 48 positions shown · non-contrast
Comparison: Prior CT from 02/22/2017.

CLINICAL DATA: Initial evaluation for left-sided numbness.

EXAM:
MRI HEAD WITHOUT CONTRAST
MRA HEAD WITHOUT CONTRAST
TECHNIQUE: Multiplanar, multiecho pulse sequences of the brain and surrounding
structures were obtained without intravenous contrast. Angiographic
images of the head were obtained using MRA technique without
contrast.

[Series 2: GRE · sagittal · 5.0mm · 0.45mm/px · 3 of 23 slices shown (1 of 2)]
[im 1/23]
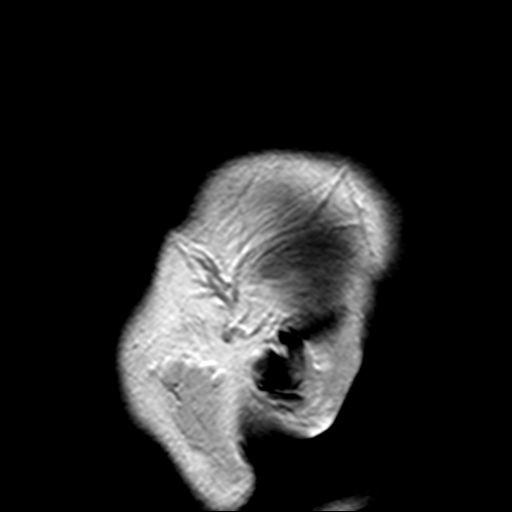
[im 12/23]
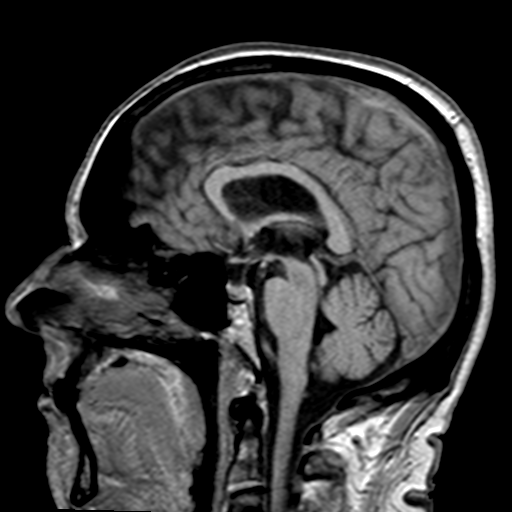
[im 23/23]
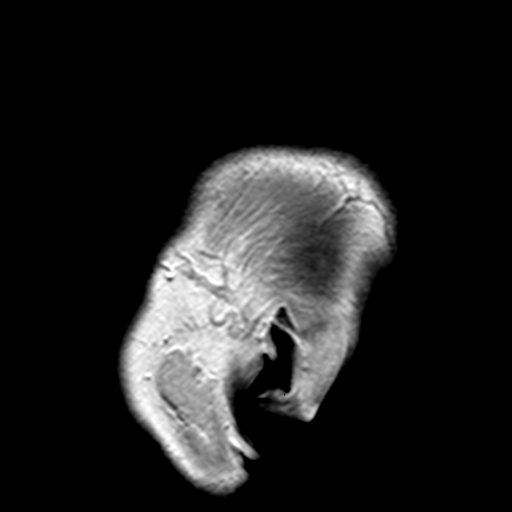

[Series 4: DWI · axial · 3.0mm · 1.80mm/px · z∈[-43,+108]mm · 6 of 55 slices shown (1 of 2)]
[im 1/55]
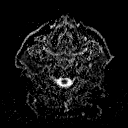
[im 11/55]
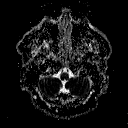
[im 22/55]
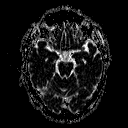
[im 33/55]
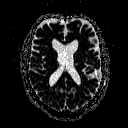
[im 44/55]
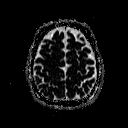
[im 55/55]
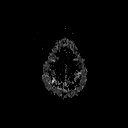

[Series 6: DWI · coronal · 3.0mm · 1.80mm/px · 5 of 44 slices shown (2 of 2)]
[im 1/44]
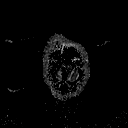
[im 11/44]
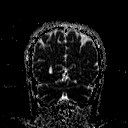
[im 22/44]
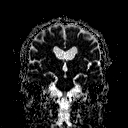
[im 33/44]
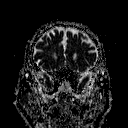
[im 44/44]
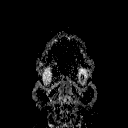

[Series 7: TOF · axial · non-contrast · 0.7mm · 0.37mm/px · z∈[-36,-6]mm · 4 of 110 slices shown]
[im 1/110]
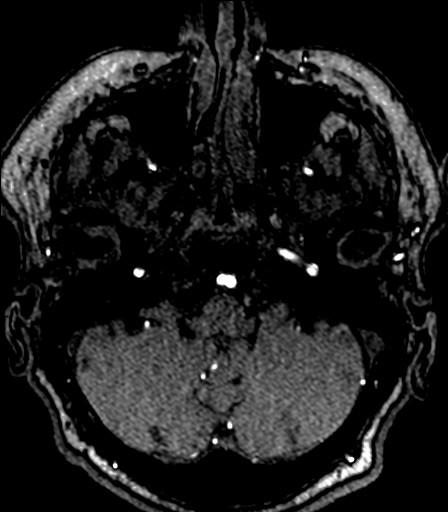
[im 22/110]
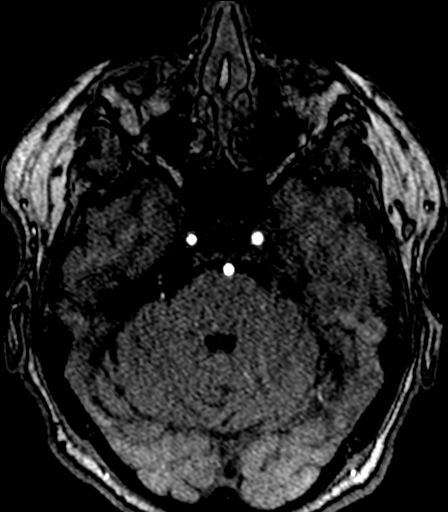
[im 33/110]
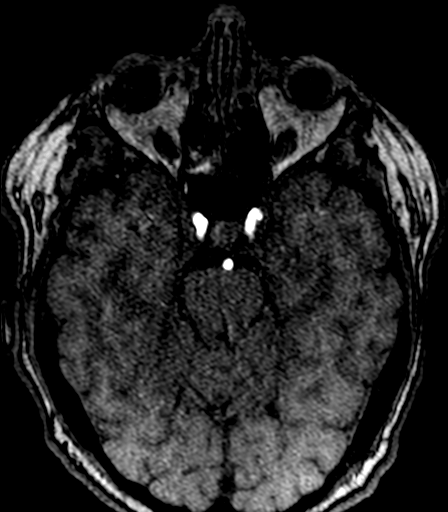
[im 44/110]
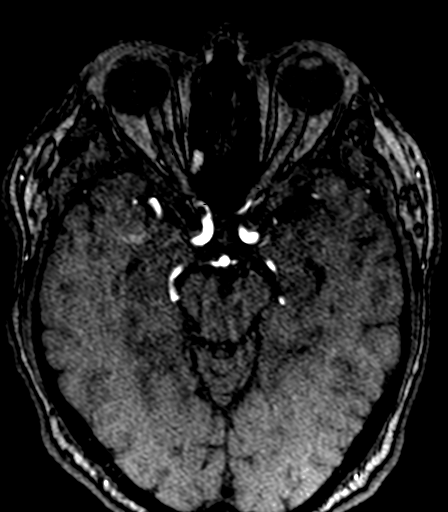

[Series 11: T2 · axial · 5.0mm · 0.45mm/px · z∈[-38,+107]mm · 2 of 25 slices shown (1 of 3)]
[im 1/25]
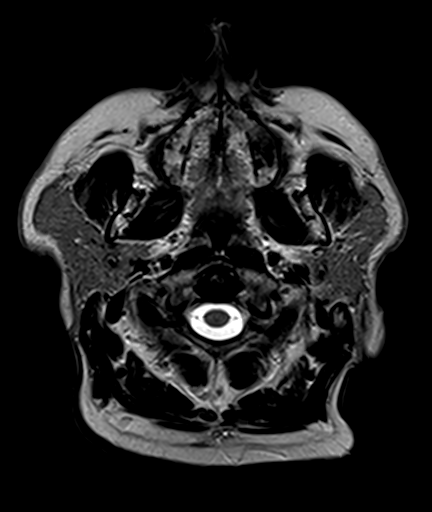
[im 25/25]
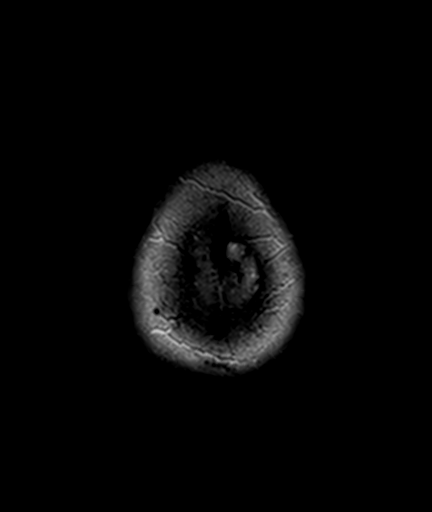

[Series 13: FLAIR · axial · 3.0mm · 0.45mm/px · z∈[-38,+107]mm · 5 of 53 slices shown]
[im 1/53]
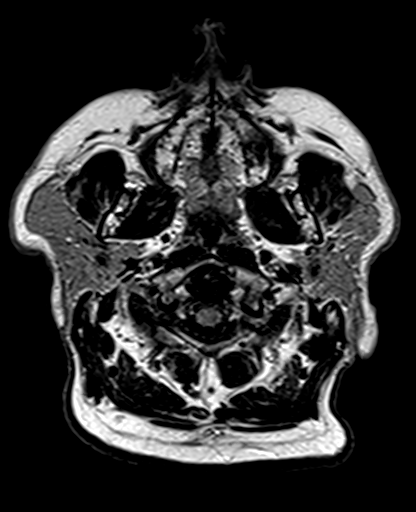
[im 14/53]
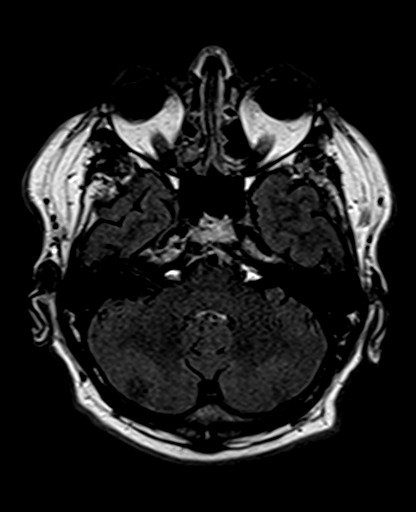
[im 27/53]
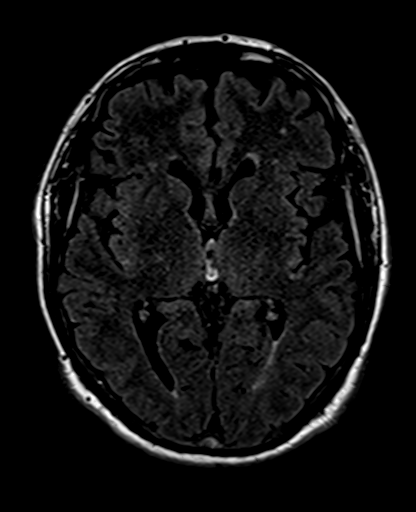
[im 40/53]
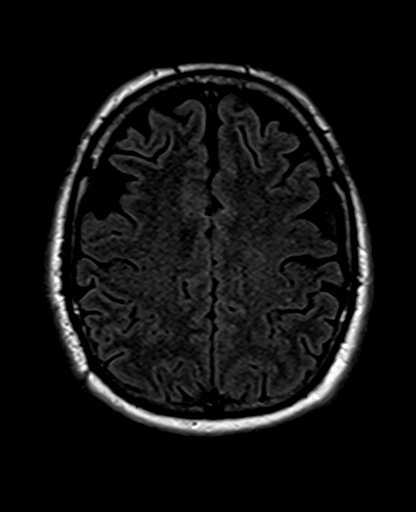
[im 53/53]
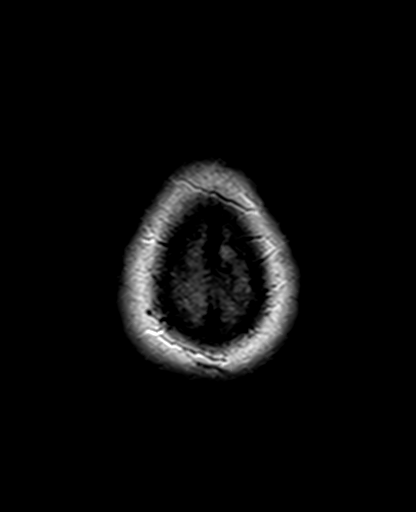

[Series 14: T2 · axial · 5.0mm · 1.20mm/px · z∈[-38,+107]mm · 2 of 25 slices shown (2 of 3)]
[im 1/25]
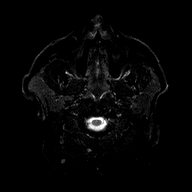
[im 25/25]
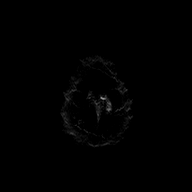

[Series 15: GRE · axial · 5.0mm · 0.45mm/px · z∈[-38,+107]mm · 2 of 25 slices shown (2 of 2)]
[im 1/25]
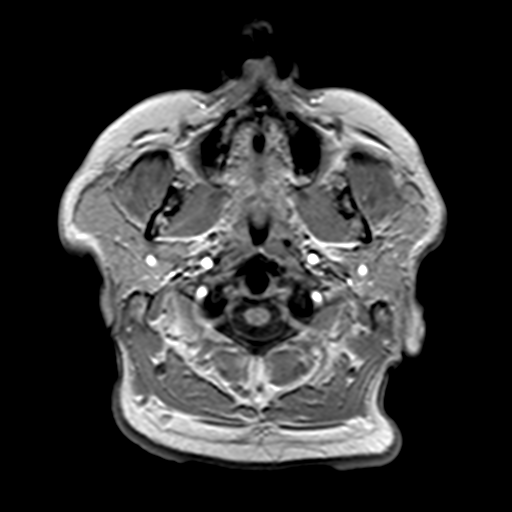
[im 25/25]
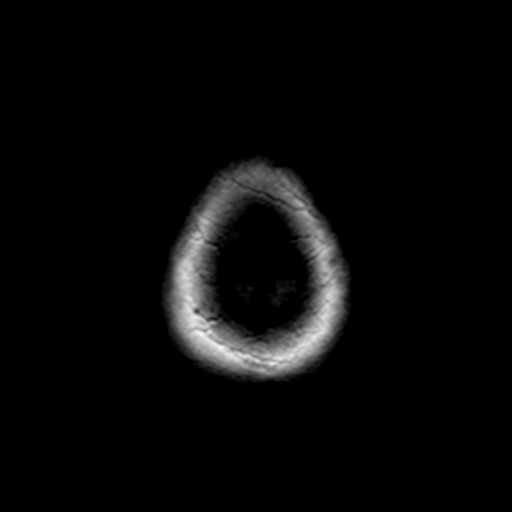

[Series 16: T2 · coronal · 5.0mm · 0.45mm/px · 3 of 27 slices shown (3 of 3)]
[im 1/27]
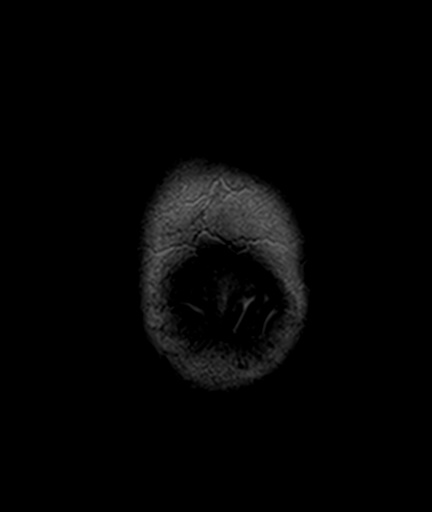
[im 14/27]
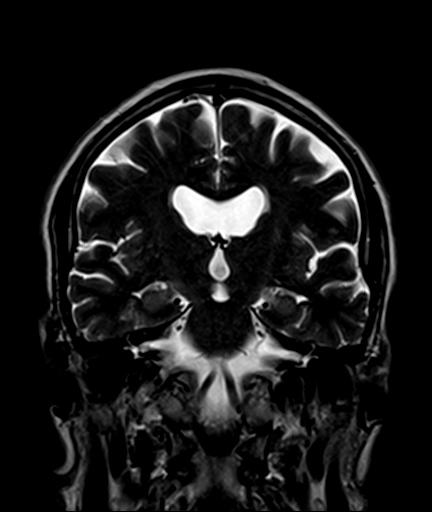
[im 27/27]
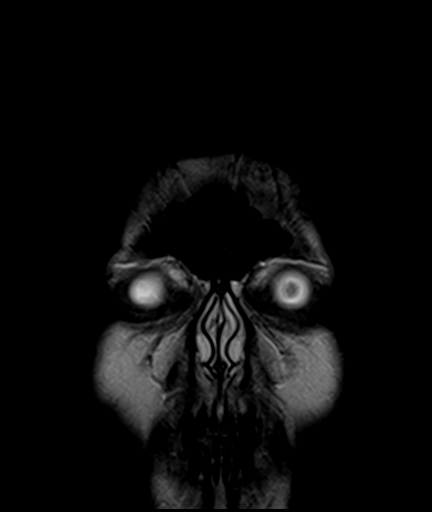

[32 of 48 positions shown; findings below may reference images not displayed]

FINDINGS: MRI HEAD FINDINGS

Brain: Generalized age-related cerebral atrophy. Few scattered
patchy T2/FLAIR hyperintensities within the periventricular, deep,
and subcortical white matter both cerebral hemispheres, nonspecific,
but most like related chronic small vessel ischemic disease, mild
for age. Chronic microvascular changes present within the pons as
well. Small remote lacunar infarct present within the left basal
ganglia/corona radiata.

No abnormal foci of restricted diffusion to suggest acute or
subacute ischemia. Gray-white matter differentiation maintained. No
other evidence for chronic infarction. No foci of susceptibility
artifact to suggest acute or chronic intracranial hemorrhage.

No mass lesion, midline shift or mass effect. No hydrocephalus. No
extra-axial fluid collection. Major dural sinuses are grossly
patent.

Pituitary gland suprasellar region normal. Midline structures intact
and normal.

Vascular: Major intracranial vascular flow voids maintained.

Skull and upper cervical spine: Craniocervical junction within
normal limits. Visualized upper cervical spine unremarkable. Bone
marrow signal intensity within normal limits. No scalp soft tissue
abnormality.

Sinuses/Orbits: Globes and orbital soft tissues within normal
limits. Patient status post lens extraction on the right. Paranasal
sinuses are clear. No mastoid effusion. Inner ear structures grossly
normal.

Other: None.

MRA HEAD FINDINGS

ANTERIOR CIRCULATION:

Petrous segments widely patent bilaterally. Mild atheromatous
irregularity within the cavernous ICAs without flow-limiting
stenosis. Supraclinoid segments patent bilaterally. ICA termini
widely patent. A1 segments, anterior communicating artery, and
anterior cerebral arteries widely patent to their distal aspects. M1
segments widely patent without stenosis or occlusion. Right M1
segment bifurcates early. No proximal M2 occlusion. Distal MCA
branches well perfused and symmetric.

POSTERIOR CIRCULATION:

Vertebral arteries not included on this examination. Basilar artery
widely patent to its distal aspect. Superior cerebellar and
posterior cerebral arteries widely patent bilaterally.

No aneurysm.
IMPRESSION: MRI HEAD IMPRESSION:

1. No acute intracranial infarct or other abnormality identified.
2. Subcentimeter remote lacunar infarct involving the left basal
ganglia/corona radiata.
3. Mild for age chronic microvascular ischemic disease.

MRA HEAD IMPRESSION:

Normal intracranial MRA.

## 2018-03-14 DIAGNOSIS — G4733 Obstructive sleep apnea (adult) (pediatric): Secondary | ICD-10-CM | POA: Diagnosis not present

## 2018-06-22 DIAGNOSIS — I10 Essential (primary) hypertension: Secondary | ICD-10-CM | POA: Diagnosis not present

## 2018-07-02 DIAGNOSIS — G4733 Obstructive sleep apnea (adult) (pediatric): Secondary | ICD-10-CM | POA: Diagnosis not present

## 2018-07-02 DIAGNOSIS — F329 Major depressive disorder, single episode, unspecified: Secondary | ICD-10-CM | POA: Diagnosis not present

## 2018-07-02 DIAGNOSIS — Z8673 Personal history of transient ischemic attack (TIA), and cerebral infarction without residual deficits: Secondary | ICD-10-CM | POA: Diagnosis not present

## 2018-07-02 DIAGNOSIS — I10 Essential (primary) hypertension: Secondary | ICD-10-CM | POA: Diagnosis not present

## 2018-07-02 DIAGNOSIS — Z0000 Encounter for general adult medical examination without abnormal findings: Secondary | ICD-10-CM | POA: Diagnosis not present

## 2018-09-06 DIAGNOSIS — Z8673 Personal history of transient ischemic attack (TIA), and cerebral infarction without residual deficits: Secondary | ICD-10-CM | POA: Diagnosis not present

## 2018-09-06 DIAGNOSIS — E7800 Pure hypercholesterolemia, unspecified: Secondary | ICD-10-CM | POA: Diagnosis not present

## 2018-09-06 DIAGNOSIS — I10 Essential (primary) hypertension: Secondary | ICD-10-CM | POA: Diagnosis not present

## 2018-09-06 DIAGNOSIS — G4733 Obstructive sleep apnea (adult) (pediatric): Secondary | ICD-10-CM | POA: Diagnosis not present

## 2018-10-15 DIAGNOSIS — H16102 Unspecified superficial keratitis, left eye: Secondary | ICD-10-CM | POA: Diagnosis not present

## 2018-10-31 DIAGNOSIS — H2512 Age-related nuclear cataract, left eye: Secondary | ICD-10-CM | POA: Diagnosis not present

## 2018-10-31 DIAGNOSIS — H353130 Nonexudative age-related macular degeneration, bilateral, stage unspecified: Secondary | ICD-10-CM | POA: Diagnosis not present

## 2018-10-31 DIAGNOSIS — H04123 Dry eye syndrome of bilateral lacrimal glands: Secondary | ICD-10-CM | POA: Diagnosis not present

## 2018-12-25 DIAGNOSIS — I10 Essential (primary) hypertension: Secondary | ICD-10-CM | POA: Diagnosis not present

## 2019-01-01 DIAGNOSIS — I83893 Varicose veins of bilateral lower extremities with other complications: Secondary | ICD-10-CM | POA: Diagnosis not present

## 2019-01-01 DIAGNOSIS — Z8673 Personal history of transient ischemic attack (TIA), and cerebral infarction without residual deficits: Secondary | ICD-10-CM | POA: Diagnosis not present

## 2019-01-01 DIAGNOSIS — E7800 Pure hypercholesterolemia, unspecified: Secondary | ICD-10-CM | POA: Diagnosis not present

## 2019-01-01 DIAGNOSIS — F329 Major depressive disorder, single episode, unspecified: Secondary | ICD-10-CM | POA: Diagnosis not present

## 2019-01-01 DIAGNOSIS — G4733 Obstructive sleep apnea (adult) (pediatric): Secondary | ICD-10-CM | POA: Diagnosis not present

## 2019-01-01 DIAGNOSIS — I10 Essential (primary) hypertension: Secondary | ICD-10-CM | POA: Diagnosis not present

## 2019-01-01 DIAGNOSIS — Z0001 Encounter for general adult medical examination with abnormal findings: Secondary | ICD-10-CM | POA: Diagnosis not present

## 2019-06-26 DIAGNOSIS — I10 Essential (primary) hypertension: Secondary | ICD-10-CM | POA: Diagnosis not present

## 2019-07-03 DIAGNOSIS — F325 Major depressive disorder, single episode, in full remission: Secondary | ICD-10-CM | POA: Diagnosis not present

## 2019-07-03 DIAGNOSIS — Z8673 Personal history of transient ischemic attack (TIA), and cerebral infarction without residual deficits: Secondary | ICD-10-CM | POA: Diagnosis not present

## 2019-07-03 DIAGNOSIS — Z0000 Encounter for general adult medical examination without abnormal findings: Secondary | ICD-10-CM | POA: Diagnosis not present

## 2019-07-03 DIAGNOSIS — Z87891 Personal history of nicotine dependence: Secondary | ICD-10-CM | POA: Diagnosis not present

## 2019-07-03 DIAGNOSIS — E7800 Pure hypercholesterolemia, unspecified: Secondary | ICD-10-CM | POA: Diagnosis not present

## 2019-07-03 DIAGNOSIS — I10 Essential (primary) hypertension: Secondary | ICD-10-CM | POA: Diagnosis not present

## 2019-07-03 DIAGNOSIS — G4733 Obstructive sleep apnea (adult) (pediatric): Secondary | ICD-10-CM | POA: Diagnosis not present

## 2019-09-04 DIAGNOSIS — I10 Essential (primary) hypertension: Secondary | ICD-10-CM | POA: Diagnosis not present

## 2019-09-04 DIAGNOSIS — E7800 Pure hypercholesterolemia, unspecified: Secondary | ICD-10-CM | POA: Diagnosis not present

## 2019-12-30 DIAGNOSIS — I10 Essential (primary) hypertension: Secondary | ICD-10-CM | POA: Diagnosis not present

## 2019-12-30 DIAGNOSIS — E7800 Pure hypercholesterolemia, unspecified: Secondary | ICD-10-CM | POA: Diagnosis not present

## 2020-01-06 DIAGNOSIS — F325 Major depressive disorder, single episode, in full remission: Secondary | ICD-10-CM | POA: Diagnosis not present

## 2020-01-06 DIAGNOSIS — E7800 Pure hypercholesterolemia, unspecified: Secondary | ICD-10-CM | POA: Diagnosis not present

## 2020-01-06 DIAGNOSIS — Z8673 Personal history of transient ischemic attack (TIA), and cerebral infarction without residual deficits: Secondary | ICD-10-CM | POA: Diagnosis not present

## 2020-01-06 DIAGNOSIS — I10 Essential (primary) hypertension: Secondary | ICD-10-CM | POA: Diagnosis not present

## 2020-01-06 DIAGNOSIS — Z0001 Encounter for general adult medical examination with abnormal findings: Secondary | ICD-10-CM | POA: Diagnosis not present

## 2020-06-29 DIAGNOSIS — I10 Essential (primary) hypertension: Secondary | ICD-10-CM | POA: Diagnosis not present

## 2020-07-06 DIAGNOSIS — E7800 Pure hypercholesterolemia, unspecified: Secondary | ICD-10-CM | POA: Diagnosis not present

## 2020-07-06 DIAGNOSIS — Z125 Encounter for screening for malignant neoplasm of prostate: Secondary | ICD-10-CM | POA: Diagnosis not present

## 2020-07-06 DIAGNOSIS — I10 Essential (primary) hypertension: Secondary | ICD-10-CM | POA: Diagnosis not present

## 2020-07-06 DIAGNOSIS — Z8673 Personal history of transient ischemic attack (TIA), and cerebral infarction without residual deficits: Secondary | ICD-10-CM | POA: Diagnosis not present

## 2020-07-06 DIAGNOSIS — Z0000 Encounter for general adult medical examination without abnormal findings: Secondary | ICD-10-CM | POA: Diagnosis not present

## 2020-07-06 DIAGNOSIS — G4733 Obstructive sleep apnea (adult) (pediatric): Secondary | ICD-10-CM | POA: Diagnosis not present

## 2020-07-06 DIAGNOSIS — F325 Major depressive disorder, single episode, in full remission: Secondary | ICD-10-CM | POA: Diagnosis not present

## 2020-07-27 DIAGNOSIS — U071 COVID-19: Secondary | ICD-10-CM | POA: Diagnosis not present

## 2020-09-25 DIAGNOSIS — H04123 Dry eye syndrome of bilateral lacrimal glands: Secondary | ICD-10-CM | POA: Diagnosis not present

## 2020-09-25 DIAGNOSIS — H353131 Nonexudative age-related macular degeneration, bilateral, early dry stage: Secondary | ICD-10-CM | POA: Diagnosis not present

## 2020-09-25 DIAGNOSIS — H2512 Age-related nuclear cataract, left eye: Secondary | ICD-10-CM | POA: Diagnosis not present

## 2020-12-30 DIAGNOSIS — Z125 Encounter for screening for malignant neoplasm of prostate: Secondary | ICD-10-CM | POA: Diagnosis not present

## 2020-12-30 DIAGNOSIS — I10 Essential (primary) hypertension: Secondary | ICD-10-CM | POA: Diagnosis not present

## 2021-02-08 DIAGNOSIS — Z8673 Personal history of transient ischemic attack (TIA), and cerebral infarction without residual deficits: Secondary | ICD-10-CM | POA: Diagnosis not present

## 2021-02-08 DIAGNOSIS — E7800 Pure hypercholesterolemia, unspecified: Secondary | ICD-10-CM | POA: Diagnosis not present

## 2021-02-08 DIAGNOSIS — F325 Major depressive disorder, single episode, in full remission: Secondary | ICD-10-CM | POA: Diagnosis not present

## 2021-02-08 DIAGNOSIS — I10 Essential (primary) hypertension: Secondary | ICD-10-CM | POA: Diagnosis not present

## 2021-02-08 DIAGNOSIS — Z0000 Encounter for general adult medical examination without abnormal findings: Secondary | ICD-10-CM | POA: Diagnosis not present

## 2021-02-08 DIAGNOSIS — Z0001 Encounter for general adult medical examination with abnormal findings: Secondary | ICD-10-CM | POA: Diagnosis not present

## 2021-02-08 DIAGNOSIS — G4733 Obstructive sleep apnea (adult) (pediatric): Secondary | ICD-10-CM | POA: Diagnosis not present

## 2021-04-22 DIAGNOSIS — H04123 Dry eye syndrome of bilateral lacrimal glands: Secondary | ICD-10-CM | POA: Diagnosis not present

## 2021-04-22 DIAGNOSIS — H353132 Nonexudative age-related macular degeneration, bilateral, intermediate dry stage: Secondary | ICD-10-CM | POA: Diagnosis not present

## 2021-04-22 DIAGNOSIS — H2512 Age-related nuclear cataract, left eye: Secondary | ICD-10-CM | POA: Diagnosis not present

## 2021-04-22 DIAGNOSIS — H43811 Vitreous degeneration, right eye: Secondary | ICD-10-CM | POA: Diagnosis not present

## 2021-06-08 DIAGNOSIS — M1990 Unspecified osteoarthritis, unspecified site: Secondary | ICD-10-CM | POA: Diagnosis not present

## 2021-06-08 DIAGNOSIS — R0602 Shortness of breath: Secondary | ICD-10-CM | POA: Diagnosis not present

## 2021-06-08 DIAGNOSIS — R600 Localized edema: Secondary | ICD-10-CM | POA: Diagnosis not present

## 2021-08-09 DIAGNOSIS — I10 Essential (primary) hypertension: Secondary | ICD-10-CM | POA: Diagnosis not present

## 2021-08-11 DIAGNOSIS — Z125 Encounter for screening for malignant neoplasm of prostate: Secondary | ICD-10-CM | POA: Diagnosis not present

## 2021-08-11 DIAGNOSIS — G4733 Obstructive sleep apnea (adult) (pediatric): Secondary | ICD-10-CM | POA: Diagnosis not present

## 2021-08-11 DIAGNOSIS — F325 Major depressive disorder, single episode, in full remission: Secondary | ICD-10-CM | POA: Diagnosis not present

## 2021-08-11 DIAGNOSIS — I10 Essential (primary) hypertension: Secondary | ICD-10-CM | POA: Diagnosis not present

## 2021-08-11 DIAGNOSIS — E7800 Pure hypercholesterolemia, unspecified: Secondary | ICD-10-CM | POA: Diagnosis not present

## 2022-01-04 DIAGNOSIS — J101 Influenza due to other identified influenza virus with other respiratory manifestations: Secondary | ICD-10-CM | POA: Diagnosis not present

## 2022-01-04 DIAGNOSIS — Z03818 Encounter for observation for suspected exposure to other biological agents ruled out: Secondary | ICD-10-CM | POA: Diagnosis not present

## 2022-02-04 DIAGNOSIS — H353132 Nonexudative age-related macular degeneration, bilateral, intermediate dry stage: Secondary | ICD-10-CM | POA: Diagnosis not present

## 2022-02-04 DIAGNOSIS — H04123 Dry eye syndrome of bilateral lacrimal glands: Secondary | ICD-10-CM | POA: Diagnosis not present

## 2022-02-04 DIAGNOSIS — H43811 Vitreous degeneration, right eye: Secondary | ICD-10-CM | POA: Diagnosis not present

## 2022-02-04 DIAGNOSIS — H2512 Age-related nuclear cataract, left eye: Secondary | ICD-10-CM | POA: Diagnosis not present

## 2022-02-04 DIAGNOSIS — H1045 Other chronic allergic conjunctivitis: Secondary | ICD-10-CM | POA: Diagnosis not present

## 2022-02-04 DIAGNOSIS — H18599 Other hereditary corneal dystrophies, unspecified eye: Secondary | ICD-10-CM | POA: Diagnosis not present

## 2022-02-09 DIAGNOSIS — I10 Essential (primary) hypertension: Secondary | ICD-10-CM | POA: Diagnosis not present

## 2022-02-09 DIAGNOSIS — Z125 Encounter for screening for malignant neoplasm of prostate: Secondary | ICD-10-CM | POA: Diagnosis not present

## 2022-02-16 DIAGNOSIS — Z8673 Personal history of transient ischemic attack (TIA), and cerebral infarction without residual deficits: Secondary | ICD-10-CM | POA: Diagnosis not present

## 2022-02-16 DIAGNOSIS — Z1331 Encounter for screening for depression: Secondary | ICD-10-CM | POA: Diagnosis not present

## 2022-02-16 DIAGNOSIS — E7800 Pure hypercholesterolemia, unspecified: Secondary | ICD-10-CM | POA: Diagnosis not present

## 2022-02-16 DIAGNOSIS — I10 Essential (primary) hypertension: Secondary | ICD-10-CM | POA: Diagnosis not present

## 2022-02-16 DIAGNOSIS — G4733 Obstructive sleep apnea (adult) (pediatric): Secondary | ICD-10-CM | POA: Diagnosis not present

## 2022-02-16 DIAGNOSIS — Z0001 Encounter for general adult medical examination with abnormal findings: Secondary | ICD-10-CM | POA: Diagnosis not present

## 2022-02-16 DIAGNOSIS — Z0000 Encounter for general adult medical examination without abnormal findings: Secondary | ICD-10-CM | POA: Diagnosis not present

## 2022-02-16 DIAGNOSIS — F325 Major depressive disorder, single episode, in full remission: Secondary | ICD-10-CM | POA: Diagnosis not present

## 2022-02-22 DIAGNOSIS — H25812 Combined forms of age-related cataract, left eye: Secondary | ICD-10-CM | POA: Diagnosis not present

## 2022-02-22 DIAGNOSIS — H353122 Nonexudative age-related macular degeneration, left eye, intermediate dry stage: Secondary | ICD-10-CM | POA: Diagnosis not present

## 2022-02-22 DIAGNOSIS — H353113 Nonexudative age-related macular degeneration, right eye, advanced atrophic without subfoveal involvement: Secondary | ICD-10-CM | POA: Diagnosis not present

## 2022-02-22 DIAGNOSIS — Z961 Presence of intraocular lens: Secondary | ICD-10-CM | POA: Diagnosis not present

## 2022-02-22 DIAGNOSIS — H26491 Other secondary cataract, right eye: Secondary | ICD-10-CM | POA: Diagnosis not present

## 2022-02-24 DIAGNOSIS — H353133 Nonexudative age-related macular degeneration, bilateral, advanced atrophic without subfoveal involvement: Secondary | ICD-10-CM | POA: Diagnosis not present

## 2022-02-24 DIAGNOSIS — H353132 Nonexudative age-related macular degeneration, bilateral, intermediate dry stage: Secondary | ICD-10-CM | POA: Diagnosis not present

## 2022-03-22 DIAGNOSIS — H26491 Other secondary cataract, right eye: Secondary | ICD-10-CM | POA: Diagnosis not present

## 2022-04-04 DIAGNOSIS — H269 Unspecified cataract: Secondary | ICD-10-CM | POA: Diagnosis not present

## 2022-04-04 DIAGNOSIS — H52222 Regular astigmatism, left eye: Secondary | ICD-10-CM | POA: Diagnosis not present

## 2022-04-04 DIAGNOSIS — H25812 Combined forms of age-related cataract, left eye: Secondary | ICD-10-CM | POA: Diagnosis not present

## 2022-04-11 DIAGNOSIS — H25812 Combined forms of age-related cataract, left eye: Secondary | ICD-10-CM | POA: Diagnosis not present

## 2022-08-10 DIAGNOSIS — I10 Essential (primary) hypertension: Secondary | ICD-10-CM | POA: Diagnosis not present

## 2022-08-12 DIAGNOSIS — Z0100 Encounter for examination of eyes and vision without abnormal findings: Secondary | ICD-10-CM | POA: Diagnosis not present

## 2022-08-12 DIAGNOSIS — H18599 Other hereditary corneal dystrophies, unspecified eye: Secondary | ICD-10-CM | POA: Diagnosis not present

## 2022-08-12 DIAGNOSIS — H1045 Other chronic allergic conjunctivitis: Secondary | ICD-10-CM | POA: Diagnosis not present

## 2022-08-12 DIAGNOSIS — H353132 Nonexudative age-related macular degeneration, bilateral, intermediate dry stage: Secondary | ICD-10-CM | POA: Diagnosis not present

## 2022-08-12 DIAGNOSIS — H43811 Vitreous degeneration, right eye: Secondary | ICD-10-CM | POA: Diagnosis not present

## 2022-08-12 DIAGNOSIS — H04123 Dry eye syndrome of bilateral lacrimal glands: Secondary | ICD-10-CM | POA: Diagnosis not present

## 2022-08-17 DIAGNOSIS — Z125 Encounter for screening for malignant neoplasm of prostate: Secondary | ICD-10-CM | POA: Diagnosis not present

## 2022-08-17 DIAGNOSIS — I10 Essential (primary) hypertension: Secondary | ICD-10-CM | POA: Diagnosis not present

## 2022-08-17 DIAGNOSIS — F325 Major depressive disorder, single episode, in full remission: Secondary | ICD-10-CM | POA: Diagnosis not present

## 2022-08-17 DIAGNOSIS — Z8673 Personal history of transient ischemic attack (TIA), and cerebral infarction without residual deficits: Secondary | ICD-10-CM | POA: Diagnosis not present

## 2022-08-17 DIAGNOSIS — I83893 Varicose veins of bilateral lower extremities with other complications: Secondary | ICD-10-CM | POA: Diagnosis not present

## 2022-08-17 DIAGNOSIS — E7800 Pure hypercholesterolemia, unspecified: Secondary | ICD-10-CM | POA: Diagnosis not present

## 2022-08-23 DIAGNOSIS — H353133 Nonexudative age-related macular degeneration, bilateral, advanced atrophic without subfoveal involvement: Secondary | ICD-10-CM | POA: Diagnosis not present

## 2022-08-23 DIAGNOSIS — H353113 Nonexudative age-related macular degeneration, right eye, advanced atrophic without subfoveal involvement: Secondary | ICD-10-CM | POA: Diagnosis not present

## 2022-10-20 DIAGNOSIS — H353133 Nonexudative age-related macular degeneration, bilateral, advanced atrophic without subfoveal involvement: Secondary | ICD-10-CM | POA: Diagnosis not present

## 2022-10-20 DIAGNOSIS — H353113 Nonexudative age-related macular degeneration, right eye, advanced atrophic without subfoveal involvement: Secondary | ICD-10-CM | POA: Diagnosis not present

## 2022-12-29 DIAGNOSIS — H353133 Nonexudative age-related macular degeneration, bilateral, advanced atrophic without subfoveal involvement: Secondary | ICD-10-CM | POA: Diagnosis not present

## 2022-12-29 DIAGNOSIS — H353113 Nonexudative age-related macular degeneration, right eye, advanced atrophic without subfoveal involvement: Secondary | ICD-10-CM | POA: Diagnosis not present

## 2023-02-13 DIAGNOSIS — H1045 Other chronic allergic conjunctivitis: Secondary | ICD-10-CM | POA: Diagnosis not present

## 2023-02-13 DIAGNOSIS — H04123 Dry eye syndrome of bilateral lacrimal glands: Secondary | ICD-10-CM | POA: Diagnosis not present

## 2023-02-13 DIAGNOSIS — H43811 Vitreous degeneration, right eye: Secondary | ICD-10-CM | POA: Diagnosis not present

## 2023-02-13 DIAGNOSIS — H353132 Nonexudative age-related macular degeneration, bilateral, intermediate dry stage: Secondary | ICD-10-CM | POA: Diagnosis not present

## 2023-02-13 DIAGNOSIS — H26491 Other secondary cataract, right eye: Secondary | ICD-10-CM | POA: Diagnosis not present

## 2023-02-13 DIAGNOSIS — H18599 Other hereditary corneal dystrophies, unspecified eye: Secondary | ICD-10-CM | POA: Diagnosis not present

## 2023-02-16 DIAGNOSIS — Z125 Encounter for screening for malignant neoplasm of prostate: Secondary | ICD-10-CM | POA: Diagnosis not present

## 2023-02-16 DIAGNOSIS — R7309 Other abnormal glucose: Secondary | ICD-10-CM | POA: Diagnosis not present

## 2023-02-16 DIAGNOSIS — I10 Essential (primary) hypertension: Secondary | ICD-10-CM | POA: Diagnosis not present

## 2023-02-23 DIAGNOSIS — R7303 Prediabetes: Secondary | ICD-10-CM | POA: Diagnosis not present

## 2023-02-23 DIAGNOSIS — G4733 Obstructive sleep apnea (adult) (pediatric): Secondary | ICD-10-CM | POA: Diagnosis not present

## 2023-02-23 DIAGNOSIS — I10 Essential (primary) hypertension: Secondary | ICD-10-CM | POA: Diagnosis not present

## 2023-02-23 DIAGNOSIS — Z1331 Encounter for screening for depression: Secondary | ICD-10-CM | POA: Diagnosis not present

## 2023-02-23 DIAGNOSIS — Z0001 Encounter for general adult medical examination with abnormal findings: Secondary | ICD-10-CM | POA: Diagnosis not present

## 2023-02-23 DIAGNOSIS — E7800 Pure hypercholesterolemia, unspecified: Secondary | ICD-10-CM | POA: Diagnosis not present

## 2023-02-23 DIAGNOSIS — F325 Major depressive disorder, single episode, in full remission: Secondary | ICD-10-CM | POA: Diagnosis not present

## 2023-03-02 DIAGNOSIS — H353133 Nonexudative age-related macular degeneration, bilateral, advanced atrophic without subfoveal involvement: Secondary | ICD-10-CM | POA: Diagnosis not present

## 2023-03-02 DIAGNOSIS — H353113 Nonexudative age-related macular degeneration, right eye, advanced atrophic without subfoveal involvement: Secondary | ICD-10-CM | POA: Diagnosis not present

## 2023-03-21 DIAGNOSIS — H532 Diplopia: Secondary | ICD-10-CM | POA: Diagnosis not present

## 2023-03-23 ENCOUNTER — Other Ambulatory Visit: Payer: Self-pay | Admitting: Internal Medicine

## 2023-03-23 DIAGNOSIS — H532 Diplopia: Secondary | ICD-10-CM

## 2023-03-29 ENCOUNTER — Ambulatory Visit
Admission: RE | Admit: 2023-03-29 | Discharge: 2023-03-29 | Disposition: A | Discharge status: Home / Self Care | Source: Ambulatory Visit | Attending: Internal Medicine | Admitting: Internal Medicine

## 2023-03-29 DIAGNOSIS — H532 Diplopia: Secondary | ICD-10-CM | POA: Diagnosis not present

## 2023-03-29 DIAGNOSIS — S0990XA Unspecified injury of head, initial encounter: Secondary | ICD-10-CM | POA: Diagnosis not present

## 2023-05-10 DIAGNOSIS — K219 Gastro-esophageal reflux disease without esophagitis: Secondary | ICD-10-CM | POA: Diagnosis not present

## 2023-05-10 DIAGNOSIS — Z1211 Encounter for screening for malignant neoplasm of colon: Secondary | ICD-10-CM | POA: Diagnosis not present

## 2023-05-10 DIAGNOSIS — Z7902 Long term (current) use of antithrombotics/antiplatelets: Secondary | ICD-10-CM | POA: Diagnosis not present

## 2023-05-11 DIAGNOSIS — H353133 Nonexudative age-related macular degeneration, bilateral, advanced atrophic without subfoveal involvement: Secondary | ICD-10-CM | POA: Diagnosis not present

## 2023-05-11 DIAGNOSIS — H353113 Nonexudative age-related macular degeneration, right eye, advanced atrophic without subfoveal involvement: Secondary | ICD-10-CM | POA: Diagnosis not present

## 2023-06-05 ENCOUNTER — Ambulatory Visit: Admitting: Anesthesiology

## 2023-06-05 ENCOUNTER — Encounter: Payer: Self-pay | Admitting: Gastroenterology

## 2023-06-05 ENCOUNTER — Ambulatory Visit
Admission: RE | Admit: 2023-06-05 | Discharge: 2023-06-05 | Disposition: A | Discharge status: Home / Self Care | Attending: Gastroenterology | Admitting: Gastroenterology

## 2023-06-05 ENCOUNTER — Encounter
Admission: RE | Disposition: A | Discharge status: Home / Self Care | Payer: Self-pay | Source: Home / Self Care | Attending: Gastroenterology

## 2023-06-05 DIAGNOSIS — D122 Benign neoplasm of ascending colon: Secondary | ICD-10-CM | POA: Insufficient documentation

## 2023-06-05 DIAGNOSIS — D125 Benign neoplasm of sigmoid colon: Secondary | ICD-10-CM | POA: Diagnosis not present

## 2023-06-05 DIAGNOSIS — K635 Polyp of colon: Secondary | ICD-10-CM | POA: Diagnosis not present

## 2023-06-05 DIAGNOSIS — D120 Benign neoplasm of cecum: Secondary | ICD-10-CM | POA: Diagnosis not present

## 2023-06-05 DIAGNOSIS — K621 Rectal polyp: Secondary | ICD-10-CM | POA: Diagnosis not present

## 2023-06-05 DIAGNOSIS — D127 Benign neoplasm of rectosigmoid junction: Secondary | ICD-10-CM | POA: Diagnosis not present

## 2023-06-05 DIAGNOSIS — Z87891 Personal history of nicotine dependence: Secondary | ICD-10-CM | POA: Insufficient documentation

## 2023-06-05 DIAGNOSIS — Z7902 Long term (current) use of antithrombotics/antiplatelets: Secondary | ICD-10-CM | POA: Diagnosis not present

## 2023-06-05 DIAGNOSIS — D123 Benign neoplasm of transverse colon: Secondary | ICD-10-CM | POA: Insufficient documentation

## 2023-06-05 DIAGNOSIS — K649 Unspecified hemorrhoids: Secondary | ICD-10-CM | POA: Diagnosis not present

## 2023-06-05 DIAGNOSIS — D128 Benign neoplasm of rectum: Secondary | ICD-10-CM | POA: Insufficient documentation

## 2023-06-05 DIAGNOSIS — Z1211 Encounter for screening for malignant neoplasm of colon: Secondary | ICD-10-CM | POA: Diagnosis not present

## 2023-06-05 DIAGNOSIS — K640 First degree hemorrhoids: Secondary | ICD-10-CM | POA: Diagnosis not present

## 2023-06-05 DIAGNOSIS — I10 Essential (primary) hypertension: Secondary | ICD-10-CM | POA: Insufficient documentation

## 2023-06-05 HISTORY — PX: SUBMUCOSAL INJECTION: SHX5543

## 2023-06-05 HISTORY — PX: POLYPECTOMY: SHX149

## 2023-06-05 HISTORY — PX: COLONOSCOPY: SHX5424

## 2023-06-05 SURGERY — COLONOSCOPY
Anesthesia: General

## 2023-06-05 MED ORDER — PROPOFOL 1000 MG/100ML IV EMUL
INTRAVENOUS | Status: AC
  Filled 2023-06-05: qty 100

## 2023-06-05 MED ORDER — SODIUM CHLORIDE 0.9 % IV SOLN
INTRAVENOUS | Status: DC
  Administered 2023-06-05: 20 mL/h via INTRAVENOUS

## 2023-06-05 MED ORDER — PROPOFOL 500 MG/50ML IV EMUL
INTRAVENOUS | Status: DC | PRN
  Administered 2023-06-05: 150 ug/kg/min via INTRAVENOUS

## 2023-06-05 MED ORDER — PROPOFOL 10 MG/ML IV BOLUS
INTRAVENOUS | Status: DC | PRN
  Administered 2023-06-05: 100 mg via INTRAVENOUS

## 2023-06-05 MED ORDER — SPOT INK MARKER SYRINGE KIT
PACK | SUBMUCOSAL | Status: DC | PRN
  Administered 2023-06-05: 4 mL via SUBMUCOSAL

## 2023-06-05 NOTE — Transfer of Care (Signed)
 Immediate Anesthesia Transfer of Care Note  Patient: Aaron Hubbard  Procedure(s) Performed: COLONOSCOPY POLYPECTOMY, INTESTINE INJECTION, SUBMUCOSAL  Patient Location: PACU  Anesthesia Type:General  Level of Consciousness: awake and sedated  Airway & Oxygen Therapy: Patient Spontanous Breathing and Patient connected to nasal cannula oxygen  Post-op Assessment: Report given to RN and Post -op Vital signs reviewed and stable  Post vital signs: Reviewed and stable  Last Vitals:  Vitals Value Taken Time  BP 134/79 06/05/23 1112  Temp 36.1 C 06/05/23 1112  Pulse 71 06/05/23 1117  Resp 16 06/05/23 1117  SpO2 94 % 06/05/23 1117  Vitals shown include unfiled device data.  Last Pain:  Vitals:   06/05/23 1112  TempSrc: Temporal  PainSc: Asleep         Complications: There were no known notable events for this encounter.

## 2023-06-05 NOTE — Interval H&P Note (Signed)
 History and Physical Interval Note: Preprocedure H&P from 06/05/23  was reviewed and there was no interval change after seeing and examining the patient.  Written consent was obtained from the patient after discussion of risks, benefits, and alternatives. Patient has consented to proceed with Colonoscopy with possible intervention   06/05/2023 10:04 AM  Aaron Hubbard  has presented today for surgery, with the diagnosis of Colon cancer screening (Z12.11).  The various methods of treatment have been discussed with the patient and family. After consideration of risks, benefits and other options for treatment, the patient has consented to  Procedure(s): COLONOSCOPY (N/A) as a surgical intervention.  The patient's history has been reviewed, patient examined, no change in status, stable for surgery.  I have reviewed the patient's chart and labs.  Questions were answered to the patient's satisfaction.     [name]

## 2023-06-05 NOTE — Anesthesia Preprocedure Evaluation (Signed)
 Anesthesia Evaluation  Patient identified by MRN, date of birth, ID band Patient awake    Reviewed: Allergy & Precautions, H&P , NPO status , Patient's Chart, lab work & pertinent test results, reviewed documented beta blocker date and time   Airway Mallampati: II   Neck ROM: full    Dental  (+) Poor Dentition   Pulmonary neg pulmonary ROS, former smoker   Pulmonary exam normal        Cardiovascular Exercise Tolerance: Good hypertension, On Medications negative cardio ROS Normal cardiovascular exam Rhythm:regular Rate:Normal     Neuro/Psych    Depression    CVA  negative psych ROS   GI/Hepatic negative GI ROS, Neg liver ROS,,,  Endo/Other  negative endocrine ROS    Renal/GU negative Renal ROS  negative genitourinary   Musculoskeletal   Abdominal   Peds  Hematology negative hematology ROS (+)   Anesthesia Other Findings Past Medical History: No date: Hypercholesteremia No date: Hypertension 2013: Stroke (cerebrum) (HCC) Past Surgical History: No date: Photoselective vaporization of prostate   Reproductive/Obstetrics negative OB ROS                             Anesthesia Physical Anesthesia Plan  ASA: 3  Anesthesia Plan: General   Post-op Pain Management:    Induction:   PONV Risk Score and Plan:   Airway Management Planned:   Additional Equipment:   Intra-op Plan:   Post-operative Plan:   Informed Consent: I have reviewed the patients History and Physical, chart, labs and discussed the procedure including the risks, benefits and alternatives for the proposed anesthesia with the patient or authorized representative who has indicated his/her understanding and acceptance.     Dental Advisory Given  Plan Discussed with: CRNA  Anesthesia Plan Comments:        Anesthesia Quick Evaluation

## 2023-06-05 NOTE — H&P (Signed)
 Pre-Procedure H&P   Patient ID: Aaron Hubbard is a 78 y.o. male.  Gastroenterology Provider: [name], DO  Referring Provider: Laquetta Plank, PA PCP: [facility]  Date: 06/05/2023  HPI Aaron Hubbard is a 78 y.o. male who presents today for Colonoscopy for Colorectal cancer screening .  Patient last underwent colonoscopy in December 2014 only demonstrating hyperplastic polyps and hemorrhoids No family history of colon cancer or colon polyps  He reports regular bowel moods without melena or hematochezia  He is on Plavix  was been held for the procedure (at least 5 days ago)  Hemoglobin 15.6 MCV 93 platelets 226,000 creatinine 1.4   Past Medical History:  Diagnosis Date   Hypercholesteremia    Hypertension    Stroke (cerebrum) (HCC) 2013    Past Surgical History:  Procedure Laterality Date   Photoselective vaporization of prostate      Family History No h/o GI disease or malignancy  Review of Systems  Constitutional:  Negative for activity change, appetite change, chills, diaphoresis, fatigue, fever and unexpected weight change.  HENT:  Negative for trouble swallowing and voice change.   Respiratory:  Negative for shortness of breath and wheezing.   Cardiovascular:  Negative for chest pain, palpitations and leg swelling.  Gastrointestinal:  Negative for abdominal distention, abdominal pain, anal bleeding, blood in stool, constipation, diarrhea, nausea and vomiting.  Musculoskeletal:  Negative for arthralgias and myalgias.  Skin:  Negative for color change and pallor.  Neurological:  Negative for dizziness, syncope and weakness.  Psychiatric/Behavioral:  Negative for confusion. The patient is not nervous/anxious.   All other systems reviewed and are negative.    Medications No current facility-administered medications on file prior to encounter.   Current Outpatient Medications on File Prior to Encounter  Medication Sig Dispense  Refill   aspirin  EC 81 MG tablet Take 1 tablet by mouth every evening.      atorvastatin  (LIPITOR) 40 MG tablet Take 1 tablet (40 mg total) by mouth daily at 6 PM. 30 tablet 0   calcium -vitamin D (CALCIUM  500/D) 500-200 MG-UNIT tablet Take 1 tablet by mouth 2 (two) times daily.     citalopram  (CELEXA ) 40 MG tablet Take one tablet daily. In the evening     clopidogrel  (PLAVIX ) 75 MG tablet Take 1 tablet (75 mg total) by mouth daily. 30 tablet 0   lisinopril (PRINIVIL,ZESTRIL) 10 MG tablet TAKE 1 TABLET EVERY DAY     meloxicam (MOBIC) 15 MG tablet TAKE 1 TABLET EVERY DAY     Multiple Vitamins-Minerals (ICAPS AREDS 2) CAPS Take 1 capsule by mouth 2 (two) times daily.      tiZANidine (ZANAFLEX) 4 MG tablet TAKE 1 TABLET EVERY NIGHT AS NEEDED FOR MUSCLE SPASMS     valACYclovir (VALTREX) 1000 MG tablet TAKE 2 TABLETS TWICE DAILY FOR 1 DAY. FOR ACUTE EPISODE OF HERPES LABIALIS AS NEEDED      Pertinent medications related to GI and procedure were reviewed by me with the patient prior to the procedure   Current Facility-Administered Medications:    0.9 %  sodium chloride  infusion, , Intravenous, Continuous, [name], DO, Last Rate: 20 mL/hr at 06/05/23 1610, Continued from Pre-op at 06/05/23 0956  sodium chloride  20 mL/hr at 06/05/23 9604       No Known Allergies Allergies were reviewed by me prior to the procedure  Objective   Body mass index is 28.42 kg/m. Vitals:   06/05/23 0925  BP: (!) 155/86  Pulse: 66  Resp: 20  Temp: (!) 96.8 F (36 C)  TempSrc: Temporal  SpO2: 97%  Weight: 77.5 kg  Height: 5\' 5"  (1.651 m)     Physical Exam Vitals and nursing note reviewed.  Constitutional:      General: He is not in acute distress.    Appearance: Normal appearance. He is not ill-appearing, toxic-appearing or diaphoretic.  HENT:     Head: Normocephalic and atraumatic.     Nose: Nose normal.     Mouth/Throat:     Mouth: Mucous membranes are moist.     Pharynx:  Oropharynx is clear.  Eyes:     General: No scleral icterus.    Extraocular Movements: Extraocular movements intact.  Cardiovascular:     Rate and Rhythm: Normal rate and regular rhythm.     Heart sounds: Normal heart sounds. No murmur heard.    No friction rub. No gallop.  Pulmonary:     Effort: Pulmonary effort is normal. No respiratory distress.     Breath sounds: Normal breath sounds. No wheezing, rhonchi or rales.  Abdominal:     General: Bowel sounds are normal. There is no distension.     Palpations: Abdomen is soft.     Tenderness: There is no abdominal tenderness. There is no guarding or rebound.  Musculoskeletal:     Cervical back: Neck supple.     Right lower leg: No edema.     Left lower leg: No edema.  Skin:    General: Skin is warm and dry.     Coloration: Skin is not jaundiced or pale.  Neurological:     General: No focal deficit present.     Mental Status: He is alert and oriented to person, place, and time. Mental status is at baseline.  Psychiatric:        Mood and Affect: Mood normal.        Behavior: Behavior normal.        Thought Content: Thought content normal.        Judgment: Judgment normal.      Assessment:  Aaron Hubbard is a 78 y.o. male  who presents today for Colonoscopy for Colorectal cancer screening .  Plan:  Colonoscopy with possible intervention today  Colonoscopy with possible biopsy, control of bleeding, polypectomy, and interventions as necessary has been discussed with the patient/patient representative. Informed consent was obtained from the patient/patient representative after explaining the indication, nature, and risks of the procedure including but not limited to death, bleeding, perforation, missed neoplasm/lesions, cardiorespiratory compromise, and reaction to medications. Opportunity for questions was given and appropriate answers were provided. Patient/patient representative has verbalized understanding is amenable to  undergoing the procedure.   [name], DO  [facility]  Portions of the record may have been created with voice recognition software. Occasional wrong-word or 'sound-a-like' substitutions may have occurred due to the inherent limitations of voice recognition software.  Read the chart carefully and recognize, using context, where substitutions may have occurred.

## 2023-06-05 NOTE — Op Note (Signed)
 [facility]astroenterology Patient Name: Aaron Hubbard Procedure Date: 06/05/2023 9:58 AM MRN: [id number] Account #: [id number] Date of Birth: [birth year] Admit Type: Outpatient Age: 78 Room: [facility] ENDO ROOM 2 Gender: Male Note Status: Finalized Instrument Name: Colonoscope [id number] Procedure:             Colonoscopy Indications:           Screening for colorectal malignant neoplasm Providers:             [name] DO, DO Referring MD:          Cherisse Cornell (Referring MD) Medicines:             Monitored Anesthesia Care Complications:         No immediate complications. Estimated blood loss:                         Minimal. Procedure:             Pre-Anesthesia Assessment:                        - Prior to the procedure, a History and Physical was                         performed, and patient medications and allergies were                         reviewed. The patient is competent. The risks and                         benefits of the procedure and the sedation options and                         risks were discussed with the patient. All questions                         were answered and informed consent was obtained.                         Patient identification and proposed procedure were                         verified by the physician, the nurse, the anesthetist                         and the technician in the endoscopy suite. Mental                         Status Examination: alert and oriented. Airway                         Examination: normal oropharyngeal airway and neck                         mobility. Respiratory Examination: clear to                         auscultation. CV Examination: RRR, no murmurs, no S3  or S4. Prophylactic Antibiotics: The patient does not                         require prophylactic antibiotics. Prior                         Anticoagulants: The patient has taken Plavix                           (clopidogrel ), last dose was 6 days prior to                         procedure. ASA Grade Assessment: III - A patient with                         severe systemic disease. After reviewing the risks and                         benefits, the patient was deemed in satisfactory                         condition to undergo the procedure. The anesthesia                         plan was to use monitored anesthesia care (MAC).                         Immediately prior to administration of medications,                         the patient was re-assessed for adequacy to receive                         sedatives. The heart rate, respiratory rate, oxygen                         saturations, blood pressure, adequacy of pulmonary                         ventilation, and response to care were monitored                         throughout the procedure. The physical status of the                         patient was re-assessed after the procedure.                        After obtaining informed consent, the colonoscope was                         passed under direct vision. Throughout the procedure,                         the patient's blood pressure, pulse, and oxygen                         saturations were monitored continuously. The  Colonoscope was introduced through the anus and                         advanced to the the terminal ileum, with                         identification of the appendiceal orifice and IC                         valve. The colonoscopy was performed without                         difficulty. The patient tolerated the procedure well.                         The quality of the bowel preparation was evaluated                         using the BBPS [facility] Bowel Preparation Scale) with                         scores of: Right Colon = 3 (entire mucosa seen well                         with no residual staining, small fragments of stool or                          opaque liquid), Transverse Colon = 3 (entire mucosa                         seen well with no residual staining, small fragments                         of stool or opaque liquid) and Left Colon = 2 (minor                         amount of residual staining, small fragments of stool                         and/or opaque liquid, but mucosa seen well). The total                         BBPS score equals 8. The quality of the bowel                         preparation was excellent. The terminal ileum,                         ileocecal valve, appendiceal orifice, and rectum were                         photographed. Findings:      The perianal and digital rectal examinations were normal. Pertinent       negatives include normal sphincter tone.      The terminal ileum appeared normal. Estimated blood loss: none.      Retroflexion in the right colon was performed.  Non-bleeding internal hemorrhoids were found during retroflexion. The       hemorrhoids were Grade I (internal hemorrhoids that do not prolapse).       Estimated blood loss: none.      Six sessile polyps were found in the rectum (1), sigmoid colon (1),       transverse colon (2), ascending colon (1) and cecum (1). The polyps were       1 to 2 mm in size. These polyps were removed with a jumbo cold forceps.       Resection and retrieval were complete. Estimated blood loss was minimal.      Two sessile polyps were found in the rectum and sigmoid colon. The       polyps were 3 to 5 mm in size. These polyps were removed with a cold       snare. Resection and retrieval were complete. Estimated blood loss was       minimal.      An 11 to 13 mm polyp was found in the sigmoid colon. The polyp was       sessile. Area was successfully injected with 2 mL Eleview for lesion       assessment, and this injection appeared to lift the lesion adequately.       The polyp was removed with a hot snare. Resection and retrieval  were       complete. Estimated blood loss was minimal. To prevent bleeding after       the polypectomy, one hemostatic clip was successfully placed (MR       conditional). There was no bleeding at the end of the procedure. Area       was successfully injected with 2 mL India ink for tattooing. placed just       distal to polypectomy Estimated blood loss was minimal.      A 20 to 22 mm polyp was found in the transverse colon. The polyp was       sessile. The polyp was removed with a piecemeal technique using a hot       snare. Resection and retrieval were complete. Area was successfully       injected with 2 mL Eleview for lesion assessment, and this injection       appeared to lift the lesion adequately. To prevent bleeding after the       polypectomy, one hemostatic clip was successfully placed (MR       conditional). There was no bleeding at the end of the procedure. Area       was tattooed with an injection of 2 mL of India ink. placed just distal       to the polypectomy site. Estimated blood loss was minimal.      Narrow band imaging was used after eleview for both larger polyps to       visualize borders prior to EMR. Estimated blood loss: none.      The exam was otherwise without abnormality on direct and retroflexion       views. Impression:            - The examined portion of the ileum was normal.                        - Non-bleeding internal hemorrhoids.                        -  Six 1 to 2 mm polyps in the rectum, in the sigmoid                         colon, in the transverse colon, in the ascending colon                         and in the cecum, removed with a jumbo cold forceps.                         Resected and retrieved.                        - Two 3 to 5 mm polyps in the rectum and in the                         sigmoid colon, removed with a cold snare. Resected and                         retrieved.                        - One 11 to 13 mm polyp in the sigmoid  colon, removed                         with a hot snare. Resected and retrieved. Injected.                         Clip (MR conditional) was placed.                        - One 20 to 22 mm polyp in the transverse colon,                         removed piecemeal using a hot snare. Resected and                         retrieved. Injected. Clip (MR conditional) was placed.                         Tattooed.                        - The examination was otherwise normal on direct and                         retroflexion views. Recommendation:        - Patient has a contact number available for                         emergencies. The signs and symptoms of potential                         delayed complications were discussed with the patient.                         Return to normal activities tomorrow. Written  discharge instructions were provided to the patient.                        - Discharge patient to home.                        - Soft diet today.                        - Continue present medications.                        - No ibuprofen, naproxen, or other non-steroidal                         anti-inflammatory drugs for 5 days after polyp removal.                        - Resume Plavix  (clopidogrel ) at prior dose in 3 days.                         Refer to managing physician for further adjustment of                         therapy.                        - Await pathology results.                        - Repeat colonoscopy in 6 months for surveillance                         based on pathology results.                        - Return to referring physician as previously                         scheduled.                        - The findings and recommendations were discussed with                         the patient. Procedure Code(s):     --- Professional ---                        [id number], Colonoscopy, flexible; with removal of                          tumor(s), polyp(s), or other lesion(s) by snare                         technique                        45380, 59, Colonoscopy, flexible; with biopsy, single                         or multiple  16109, Colonoscopy, flexible; with directed submucosal                         injection(s), any substance Diagnosis Code(s):     --- Professional ---                        Z12.11, Encounter for screening for malignant neoplasm                         of colon                        K64.0, First degree hemorrhoids                        D12.3, Benign neoplasm of transverse colon (hepatic                         flexure or splenic flexure)                        D12.2, Benign neoplasm of ascending colon                        D12.0, Benign neoplasm of cecum                        D12.8, Benign neoplasm of rectum                        D12.5, Benign neoplasm of sigmoid colon CPT copyright 2022 American [employer]. All rights reserved. The codes documented in this report are preliminary and upon coder review may  be revised to meet current compliance requirements. Attending Participation:      I personally performed the entire procedure. Polo Brisk, DO [name] DO, DO 06/05/2023 11:18:57 AM This report has been signed electronically. Number of Addenda: 0 Note Initiated On: 06/05/2023 9:58 AM Scope Withdrawal Time: 0 hours 37 minutes 29 seconds  Total Procedure Duration: 0 hours 52 minutes 23 seconds  Estimated Blood Loss:  Estimated blood loss was minimal.      [facility]

## 2023-06-06 ENCOUNTER — Encounter: Payer: Self-pay | Admitting: Gastroenterology

## 2023-06-06 LAB — SURGICAL PATHOLOGY

## 2023-06-07 NOTE — Anesthesia Postprocedure Evaluation (Signed)
 Anesthesia Post Note  Patient: Aaron Hubbard  Procedure(s) Performed: COLONOSCOPY POLYPECTOMY, INTESTINE INJECTION, SUBMUCOSAL  Patient location during evaluation: PACU Anesthesia Type: General Level of consciousness: awake and alert Pain management: pain level controlled Vital Signs Assessment: post-procedure vital signs reviewed and stable Respiratory status: spontaneous breathing, nonlabored ventilation, respiratory function stable and patient connected to nasal cannula oxygen Cardiovascular status: blood pressure returned to baseline and stable Postop Assessment: no apparent nausea or vomiting Anesthetic complications: no   There were no known notable events for this encounter.   Last Vitals:  Vitals:   06/05/23 1132 06/05/23 1142  BP: (!) 156/87 (!) 155/87  Pulse:    Resp:  16  Temp:    SpO2:      Last Pain:  Vitals:   06/06/23 0744  TempSrc:   PainSc: 0-No pain                 [name]

## 2023-07-13 DIAGNOSIS — H353113 Nonexudative age-related macular degeneration, right eye, advanced atrophic without subfoveal involvement: Secondary | ICD-10-CM | POA: Diagnosis not present

## 2023-07-13 DIAGNOSIS — H353133 Nonexudative age-related macular degeneration, bilateral, advanced atrophic without subfoveal involvement: Secondary | ICD-10-CM | POA: Diagnosis not present

## 2023-08-07 DIAGNOSIS — F325 Major depressive disorder, single episode, in full remission: Secondary | ICD-10-CM | POA: Diagnosis not present

## 2023-08-07 DIAGNOSIS — I8393 Asymptomatic varicose veins of bilateral lower extremities: Secondary | ICD-10-CM | POA: Diagnosis not present

## 2023-08-07 DIAGNOSIS — I10 Essential (primary) hypertension: Secondary | ICD-10-CM | POA: Diagnosis not present

## 2023-08-07 DIAGNOSIS — G4733 Obstructive sleep apnea (adult) (pediatric): Secondary | ICD-10-CM | POA: Diagnosis not present

## 2023-08-07 DIAGNOSIS — Z125 Encounter for screening for malignant neoplasm of prostate: Secondary | ICD-10-CM | POA: Diagnosis not present

## 2023-08-07 DIAGNOSIS — R7303 Prediabetes: Secondary | ICD-10-CM | POA: Diagnosis not present

## 2023-08-07 DIAGNOSIS — E7800 Pure hypercholesterolemia, unspecified: Secondary | ICD-10-CM | POA: Diagnosis not present

## 2023-09-07 DIAGNOSIS — H353133 Nonexudative age-related macular degeneration, bilateral, advanced atrophic without subfoveal involvement: Secondary | ICD-10-CM | POA: Diagnosis not present

## 2023-10-26 DIAGNOSIS — Z7902 Long term (current) use of antithrombotics/antiplatelets: Secondary | ICD-10-CM | POA: Diagnosis not present

## 2023-10-26 DIAGNOSIS — Z860101 Personal history of adenomatous and serrated colon polyps: Secondary | ICD-10-CM | POA: Diagnosis not present

## 2023-11-09 ENCOUNTER — Encounter: Payer: Self-pay | Admitting: Gastroenterology

## 2023-11-09 ENCOUNTER — Ambulatory Visit: Admitting: General Practice

## 2023-11-09 ENCOUNTER — Encounter: Admission: RE | Disposition: A | Payer: Self-pay | Source: Home / Self Care | Attending: Gastroenterology

## 2023-11-09 ENCOUNTER — Ambulatory Visit
Admission: RE | Admit: 2023-11-09 | Discharge: 2023-11-09 | Disposition: A | Discharge status: Home / Self Care | Attending: Gastroenterology | Admitting: Gastroenterology

## 2023-11-09 DIAGNOSIS — Z09 Encounter for follow-up examination after completed treatment for conditions other than malignant neoplasm: Secondary | ICD-10-CM | POA: Diagnosis not present

## 2023-11-09 DIAGNOSIS — K621 Rectal polyp: Secondary | ICD-10-CM | POA: Diagnosis not present

## 2023-11-09 DIAGNOSIS — D123 Benign neoplasm of transverse colon: Secondary | ICD-10-CM | POA: Insufficient documentation

## 2023-11-09 DIAGNOSIS — D122 Benign neoplasm of ascending colon: Secondary | ICD-10-CM | POA: Diagnosis not present

## 2023-11-09 DIAGNOSIS — D124 Benign neoplasm of descending colon: Secondary | ICD-10-CM | POA: Insufficient documentation

## 2023-11-09 DIAGNOSIS — K640 First degree hemorrhoids: Secondary | ICD-10-CM | POA: Insufficient documentation

## 2023-11-09 DIAGNOSIS — Z79899 Other long term (current) drug therapy: Secondary | ICD-10-CM | POA: Insufficient documentation

## 2023-11-09 DIAGNOSIS — Z8673 Personal history of transient ischemic attack (TIA), and cerebral infarction without residual deficits: Secondary | ICD-10-CM | POA: Diagnosis not present

## 2023-11-09 DIAGNOSIS — I10 Essential (primary) hypertension: Secondary | ICD-10-CM | POA: Diagnosis not present

## 2023-11-09 DIAGNOSIS — F32A Depression, unspecified: Secondary | ICD-10-CM | POA: Insufficient documentation

## 2023-11-09 DIAGNOSIS — Z9889 Other specified postprocedural states: Secondary | ICD-10-CM | POA: Diagnosis not present

## 2023-11-09 DIAGNOSIS — Z1211 Encounter for screening for malignant neoplasm of colon: Secondary | ICD-10-CM | POA: Insufficient documentation

## 2023-11-09 DIAGNOSIS — Z87891 Personal history of nicotine dependence: Secondary | ICD-10-CM | POA: Insufficient documentation

## 2023-11-09 DIAGNOSIS — Z860101 Personal history of adenomatous and serrated colon polyps: Secondary | ICD-10-CM | POA: Diagnosis not present

## 2023-11-09 DIAGNOSIS — Z860100 Personal history of colon polyps, unspecified: Secondary | ICD-10-CM | POA: Diagnosis present

## 2023-11-09 DIAGNOSIS — K635 Polyp of colon: Secondary | ICD-10-CM | POA: Diagnosis not present

## 2023-11-09 DIAGNOSIS — K649 Unspecified hemorrhoids: Secondary | ICD-10-CM | POA: Diagnosis not present

## 2023-11-09 HISTORY — PX: POLYPECTOMY: SHX149

## 2023-11-09 HISTORY — PX: COLONOSCOPY: SHX5424

## 2023-11-09 SURGERY — COLONOSCOPY
Anesthesia: General

## 2023-11-09 MED ORDER — SODIUM CHLORIDE 0.9 % IV SOLN
INTRAVENOUS | Status: DC
  Administered 2023-11-09: 500 mL via INTRAVENOUS

## 2023-11-09 MED ORDER — PROPOFOL 1000 MG/100ML IV EMUL
INTRAVENOUS | Status: AC
  Filled 2023-11-09: qty 100

## 2023-11-09 MED ORDER — LIDOCAINE HCL (CARDIAC) PF 100 MG/5ML IV SOSY
PREFILLED_SYRINGE | INTRAVENOUS | Status: DC | PRN
  Administered 2023-11-09: 40 mg via INTRAVENOUS

## 2023-11-09 MED ORDER — PROPOFOL 10 MG/ML IV BOLUS
INTRAVENOUS | Status: DC | PRN
  Administered 2023-11-09 (×3): 20 mg via INTRAVENOUS
  Administered 2023-11-09: 30 mg via INTRAVENOUS
  Administered 2023-11-09 (×2): 20 mg via INTRAVENOUS
  Administered 2023-11-09: 40 mg via INTRAVENOUS

## 2023-11-09 NOTE — Op Note (Signed)
 [facility]astroenterology Patient Name: Aaron Hubbard Procedure Date: 11/09/2023 8:50 AM MRN: [id number] Account #: [id number] Date of Birth: 28-Aug-1945 Admit Type: Outpatient Age: 78 Room: [facility] ENDO ROOM 1 Gender: Male Note Status: Finalized Instrument Name: Colon Scope [id number] Procedure:             Colonoscopy Indications:           High risk colon cancer surveillance: Personal history                         of colonic polyps Providers:             Elspeth Ozell Onita ROSALEA, DO Referring MD:          [facility] Medicines:             Monitored Anesthesia Care Complications:         No immediate complications. Estimated blood loss:                         Minimal. Procedure:             Pre-Anesthesia Assessment:                        - Prior to the procedure, a History and Physical was                         performed, and patient medications and allergies were                         reviewed. The patient is competent. The risks and                         benefits of the procedure and the sedation options and                         risks were discussed with the patient. All questions                         were answered and informed consent was obtained.                         Patient identification and proposed procedure were                         verified by the physician, the nurse, the anesthetist                         and the technician in the endoscopy suite. Mental                         Status Examination: alert and oriented. Airway                         Examination: normal oropharyngeal airway and neck                         mobility. Respiratory Examination: clear to  auscultation. CV Examination: RRR, no murmurs, no S3                         or S4. Prophylactic Antibiotics: The patient does not                         require prophylactic antibiotics. Prior                         Anticoagulants: The  patient has taken no anticoagulant                         or antiplatelet agents. ASA Grade Assessment: III - A                         patient with severe systemic disease. After reviewing                         the risks and benefits, the patient was deemed in                         satisfactory condition to undergo the procedure. The                         anesthesia plan was to use monitored anesthesia care                         (MAC). Immediately prior to administration of                         medications, the patient was re-assessed for adequacy                         to receive sedatives. The heart rate, respiratory                         rate, oxygen saturations, blood pressure, adequacy of                         pulmonary ventilation, and response to care were                         monitored throughout the procedure. The physical                         status of the patient was re-assessed after the                         procedure.                        After obtaining informed consent, the colonoscope was                         passed under direct vision. Throughout the procedure,                         the patient's blood pressure, pulse, and oxygen  saturations were monitored continuously. The was                         introduced through the anus and advanced to the the                         terminal ileum, with identification of the appendiceal                         orifice and IC valve. The colonoscopy was performed                         without difficulty. The patient tolerated the                         procedure well. The quality of the bowel preparation                         was evaluated using the BBPS [facility] Bowel Preparation                         Scale) with scores of: Right Colon = 3, Transverse                         Colon = 3 and Left Colon = 3 (entire mucosa seen well                         with no residual  staining, small fragments of stool or                         opaque liquid). The total BBPS score equals 9. The                         terminal ileum, ileocecal valve, appendiceal orifice,                         and rectum were photographed. Findings:      The perianal and digital rectal examinations were normal. Pertinent       negatives include normal sphincter tone.      The terminal ileum appeared normal. Estimated blood loss: none.      Retroflexion in the right colon was performed.      Non-bleeding internal hemorrhoids were found during retroflexion. The       hemorrhoids were Grade I (internal hemorrhoids that do not prolapse).       Estimated blood loss: none.      Four sessile polyps were found in the rectum, descending colon and       ascending colon (2). The polyps were 1 to 2 mm in size. These polyps       were removed with a jumbo cold forceps. Resection and retrieval were       complete. Estimated blood loss was minimal.      A 3 to 4 mm post polypectomy scar was found in the transverse colon.       There was residual polyp tissue. The polyp was removed with a cold       snare. Resection and retrieval were complete. Estimated blood loss was  minimal.      A tattoo was seen in the sigmoid colon and in the transverse colon. See       above for transverse colon. no residual polyp at sigmoid. Estimated       blood loss: none.      The exam was otherwise without abnormality on direct and retroflexion       views. Impression:            - The examined portion of the ileum was normal.                        - Non-bleeding internal hemorrhoids.                        - Four 1 to 2 mm polyps in the rectum, in the                         descending colon and in the ascending colon, removed                         with a jumbo cold forceps. Resected and retrieved.                        - Post-polypectomy scar in the transverse colon.                        - A tattoo was  seen in the sigmoid colon and in the                         transverse colon. See above for transverse colon. no                         residual polyp at sigmoid.                        - The examination was otherwise normal on direct and                         retroflexion views. Recommendation:        - Patient has a contact number available for                         emergencies. The signs and symptoms of potential                         delayed complications were discussed with the patient.                         Return to normal activities tomorrow. Written                         discharge instructions were provided to the patient.                        - Discharge patient to home.                        - Resume previous diet.                        -  Continue present medications.                        - Await pathology results.                        - Repeat colonoscopy for surveillance based on                         pathology results.                        - Return to referring physician as previously                         scheduled.                        - The findings and recommendations were discussed with                         the patient. Procedure Code(s):     --- Professional ---                        [phone], Colonoscopy, flexible; with removal of                         tumor(s), polyp(s), or other lesion(s) by snare                         technique                        45380, 59, Colonoscopy, flexible; with biopsy, single                         or multiple Diagnosis Code(s):     --- Professional ---                        Z86.010, Personal history of colonic polyps                        K64.0, First degree hemorrhoids                        D12.8, Benign neoplasm of rectum                        D12.4, Benign neoplasm of descending colon                        D12.2, Benign neoplasm of ascending colon                        Z98.890, Other  specified postprocedural states CPT copyright 2022 American Medical Association. All rights reserved. The codes documented in this report are preliminary and upon coder review may  be revised to meet current compliance requirements. Attending Participation:      I personally performed the entire procedure. Elspeth Jungling, DO [name] DO, DO 11/09/2023 9:30:24 AM This report has been signed electronically. Number of Addenda: 0 Note Initiated On: 11/09/2023 8:50 AM Scope Withdrawal  Time: 0 hours 12 minutes 0 seconds  Total Procedure Duration: 0 hours 16 minutes 3 seconds  Estimated Blood Loss:  Estimated blood loss was minimal.      [facility]

## 2023-11-09 NOTE — H&P (Signed)
 Pre-Procedure H&P   Patient ID: Aaron Hubbard is a 78 y.o. male.  Gastroenterology Provider: [name], DO  Referring Provider: Romero Antigua, PA PCP: [facility]  Date: 11/09/2023  HPI Aaron Hubbard is a 78 y.o. male who presents today for Colonoscopy for Personal history of colon polyp .  [place] 7 adenomatous polyps removed including 111 to 13 mm in size which was tattooed and clipped.  Another polyp was 20 to 22 mm in size which was resected in piecemeal fashion, clipped and tattooed.  Colonoscopy today to evaluate site due to piecemeal resection. Internal hemorrhoids also noted.  Plavix  held for procedure today (last taken 5 days ago)   Past Medical History:  Diagnosis Date   Hypercholesteremia    Hypertension    Stroke (cerebrum) (HCC) 2013    Past Surgical History:  Procedure Laterality Date   COLONOSCOPY N/A 06/05/2023   Procedure: COLONOSCOPY;  Surgeon: Jungling Elspeth Ozell, DO;  Location: [facility] ENDOSCOPY;  Service: Gastroenterology;  Laterality: N/A;   Photoselective vaporization of prostate     POLYPECTOMY  06/05/2023   Procedure: POLYPECTOMY, INTESTINE;  Surgeon: Jungling Elspeth Ozell, DO;  Location: [facility] ENDOSCOPY;  Service: Gastroenterology;;   ROBLEY INJECTION  06/05/2023   Procedure: INJECTION, SUBMUCOSAL;  Surgeon: Jungling Elspeth Ozell, DO;  Location: [facility] ENDOSCOPY;  Service: Gastroenterology;;    Family History No h/o GI disease or malignancy  Review of Systems  Constitutional:  Negative for activity change, appetite change, chills, diaphoresis, fatigue, fever and unexpected weight change.  HENT:  Negative for trouble swallowing and voice change.   Respiratory:  Negative for shortness of breath and wheezing.   Cardiovascular:  Negative for chest pain, palpitations and leg swelling.  Gastrointestinal:  Negative for abdominal distention, abdominal pain, anal  bleeding, blood in stool, constipation, diarrhea, nausea and vomiting.  Musculoskeletal:  Negative for arthralgias and myalgias.  Skin:  Negative for color change and pallor.  Neurological:  Negative for dizziness, syncope and weakness.  Psychiatric/Behavioral:  Negative for confusion. The patient is not nervous/anxious.   All other systems reviewed and are negative.    Medications No current facility-administered medications on file prior to encounter.   Current Outpatient Medications on File Prior to Encounter  Medication Sig Dispense Refill   aspirin  EC 81 MG tablet Take 1 tablet by mouth every evening.      atorvastatin  (LIPITOR) 40 MG tablet Take 1 tablet (40 mg total) by mouth daily at 6 PM. 30 tablet 0   calcium -vitamin D (CALCIUM  500/D) 500-200 MG-UNIT tablet Take 1 tablet by mouth 2 (two) times daily.     citalopram  (CELEXA ) 40 MG tablet Take one tablet daily. In the evening     meloxicam (MOBIC) 15 MG tablet TAKE 1 TABLET EVERY DAY     Multiple Vitamins-Minerals (ICAPS AREDS 2) CAPS Take 1 capsule by mouth 2 (two) times daily.      tiZANidine (ZANAFLEX) 4 MG tablet TAKE 1 TABLET EVERY NIGHT AS NEEDED FOR MUSCLE SPASMS     valACYclovir (VALTREX) 1000 MG tablet TAKE 2 TABLETS TWICE DAILY FOR 1 DAY. FOR ACUTE EPISODE OF HERPES LABIALIS AS NEEDED     clopidogrel  (PLAVIX ) 75 MG tablet Take 1 tablet (75 mg total) by mouth daily. 30 tablet 0   lisinopril (PRINIVIL,ZESTRIL) 10 MG tablet TAKE 1 TABLET EVERY DAY      Pertinent medications related to GI and procedure were reviewed by me with  the patient prior to the procedure   Current Facility-Administered Medications:    0.9 %  sodium chloride  infusion, , Intravenous, Continuous, Onita Elspeth Sharper, DO, Last Rate: 20 mL/hr at 11/09/23 0838, 500 mL at 11/09/23 9161  sodium chloride  500 mL (11/09/23 0838)       No Known Allergies Allergies were reviewed by me prior to the procedure  Objective   Body mass index is 28.62  kg/m. Vitals:   11/09/23 0827  BP: (!) 159/87  Pulse: 60  Resp: 18  Temp: (!) 96.4 F (35.8 C)  TempSrc: Temporal  SpO2: 99%  Weight: 78 kg  Height: 5' 5 (1.651 m)     Physical Exam Vitals and nursing note reviewed.  Constitutional:      General: He is not in acute distress.    Appearance: Normal appearance. He is not ill-appearing, toxic-appearing or diaphoretic.  HENT:     Head: Normocephalic and atraumatic.     Nose: Nose normal.     Mouth/Throat:     Mouth: Mucous membranes are moist.     Pharynx: Oropharynx is clear.  Eyes:     General: No scleral icterus.    Extraocular Movements: Extraocular movements intact.  Cardiovascular:     Rate and Rhythm: Normal rate and regular rhythm.     Heart sounds: Normal heart sounds. No murmur heard.    No friction rub. No gallop.  Pulmonary:     Effort: Pulmonary effort is normal. No respiratory distress.     Breath sounds: Normal breath sounds. No wheezing, rhonchi or rales.  Abdominal:     General: Bowel sounds are normal. There is no distension.     Palpations: Abdomen is soft.     Tenderness: There is no abdominal tenderness. There is no guarding or rebound.  Musculoskeletal:     Cervical back: Neck supple.     Right lower leg: No edema.     Left lower leg: No edema.  Skin:    General: Skin is warm and dry.     Coloration: Skin is not jaundiced or pale.  Neurological:     General: No focal deficit present.     Mental Status: He is alert and oriented to person, place, and time. Mental status is at baseline.  Psychiatric:        Mood and Affect: Mood normal.        Behavior: Behavior normal.        Thought Content: Thought content normal.        Judgment: Judgment normal.      Assessment:  Aaron Hubbard is a 78 y.o. male  who presents today for Colonoscopy for Personal history of colon polyp .  Plan:  Colonoscopy with possible intervention today  Colonoscopy with possible biopsy, control of bleeding,  polypectomy, and interventions as necessary has been discussed with the patient/patient representative. Informed consent was obtained from the patient/patient representative after explaining the indication, nature, and risks of the procedure including but not limited to death, bleeding, perforation, missed neoplasm/lesions, cardiorespiratory compromise, and reaction to medications. Opportunity for questions was given and appropriate answers were provided. Patient/patient representative has verbalized understanding is amenable to undergoing the procedure.   Elspeth Sharper Onita, DO  [facility]  Portions of the record may have been created with voice recognition software. Occasional wrong-word or 'sound-a-like' substitutions may have occurred due to the inherent limitations of voice recognition software.  Read the chart carefully and recognize, using context, where  substitutions may have occurred.

## 2023-11-09 NOTE — Transfer of Care (Signed)
 Immediate Anesthesia Transfer of Care Note  Patient: Aaron Hubbard  Procedure(s) Performed: COLONOSCOPY POLYPECTOMY, INTESTINE  Patient Location: PACU  Anesthesia Type:MAC  Level of Consciousness: drowsy  Airway & Oxygen Therapy: Patient Spontanous Breathing and Patient connected to nasal cannula oxygen  Post-op Assessment: Report given to RN and Post -op Vital signs reviewed and stable  Post vital signs: Reviewed and stable  Last Vitals:  Vitals Value Taken Time  BP 148/72 11/09/23 09:29  Temp 35.6 C 11/09/23 09:28  Pulse 62 11/09/23 09:29  Resp 14 11/09/23 09:30  SpO2 99 % 11/09/23 09:29  Vitals shown include unfiled device data.  Last Pain:  Vitals:   11/09/23 0928  TempSrc: Temporal  PainSc: 0-No pain         Complications: No notable events documented.

## 2023-11-09 NOTE — Anesthesia Postprocedure Evaluation (Signed)
 Anesthesia Post Note  Patient: Aaron Hubbard  Procedure(s) Performed: COLONOSCOPY POLYPECTOMY, INTESTINE  Patient location during evaluation: Endoscopy Anesthesia Type: General Level of consciousness: awake and alert Pain management: pain level controlled Vital Signs Assessment: post-procedure vital signs reviewed and stable Respiratory status: spontaneous breathing, nonlabored ventilation, respiratory function stable and patient connected to nasal cannula oxygen Cardiovascular status: blood pressure returned to baseline and stable Postop Assessment: no apparent nausea or vomiting Anesthetic complications: no   No notable events documented.   Last Vitals:  Vitals:   11/09/23 0938 11/09/23 0948  BP: 137/88 (!) 150/77  Pulse: (!) 56 (!) 59  Resp: 13   Temp:    SpO2: 98% 100%    Last Pain:  Vitals:   11/09/23 0948  TempSrc:   PainSc: 0-No pain                 Debby Mines

## 2023-11-09 NOTE — Interval H&P Note (Signed)
 History and Physical Interval Note: Preprocedure H&P from 11/09/23  was reviewed and there was no interval change after seeing and examining the patient.  Written consent was obtained from the patient after discussion of risks, benefits, and alternatives. Patient has consented to proceed with Colonoscopy with possible intervention   11/09/2023 8:59 AM  Aaron Hubbard  has presented today for surgery, with the diagnosis of Personal history of adenomatous and serrated colon polyps (Z86.0101).  The various methods of treatment have been discussed with the patient and family. After consideration of risks, benefits and other options for treatment, the patient has consented to  Procedure(s) with comments: COLONOSCOPY (N/A) - Plavix  as a surgical intervention.  The patient's history has been reviewed, patient examined, no change in status, stable for surgery.  I have reviewed the patient's chart and labs.  Questions were answered to the patient's satisfaction.     [name]

## 2023-11-09 NOTE — Anesthesia Preprocedure Evaluation (Signed)
 Anesthesia Evaluation  Patient identified by MRN, date of birth, ID band Patient awake    Reviewed: Allergy & Precautions, H&P , NPO status , Patient's Chart, lab work & pertinent test results, reviewed documented beta blocker date and time   Airway Mallampati: II   Neck ROM: full    Dental  (+) Poor Dentition   Pulmonary neg pulmonary ROS, former smoker   Pulmonary exam normal        Cardiovascular Exercise Tolerance: Good hypertension, On Medications negative cardio ROS Normal cardiovascular exam Rhythm:regular Rate:Normal     Neuro/Psych    Depression    CVA  negative psych ROS   GI/Hepatic negative GI ROS, Neg liver ROS,,,  Endo/Other  negative endocrine ROS    Renal/GU      Musculoskeletal   Abdominal   Peds  Hematology negative hematology ROS (+)   Anesthesia Other Findings Past Medical History: No date: Hypercholesteremia No date: Hypertension 2013: Stroke (cerebrum) (HCC) Past Surgical History: No date: Photoselective vaporization of prostate   Reproductive/Obstetrics negative OB ROS                              Anesthesia Physical Anesthesia Plan  ASA: 3  Anesthesia Plan: General   Post-op Pain Management: Minimal or no pain anticipated   Induction: Intravenous  PONV Risk Score and Plan: 2 and Propofol  infusion and TIVA  Airway Management Planned: Nasal Cannula  Additional Equipment: None  Intra-op Plan:   Post-operative Plan:   Informed Consent: I have reviewed the patients History and Physical, chart, labs and discussed the procedure including the risks, benefits and alternatives for the proposed anesthesia with the patient or authorized representative who has indicated his/her understanding and acceptance.     Dental advisory given  Plan Discussed with: CRNA and Surgeon  Anesthesia Plan Comments: (Discussed risks of anesthesia with patient, including  possibility of difficulty with spontaneous ventilation under anesthesia necessitating airway intervention, PONV, and rare risks such as cardiac or respiratory or neurological events, and allergic reactions. Discussed the role of CRNA in patient's perioperative care. Patient understands.)        Anesthesia Quick Evaluation

## 2023-11-13 DIAGNOSIS — H18593 Other hereditary corneal dystrophies, bilateral: Secondary | ICD-10-CM | POA: Diagnosis not present

## 2023-11-13 DIAGNOSIS — H26491 Other secondary cataract, right eye: Secondary | ICD-10-CM | POA: Diagnosis not present

## 2023-11-13 DIAGNOSIS — H04123 Dry eye syndrome of bilateral lacrimal glands: Secondary | ICD-10-CM | POA: Diagnosis not present

## 2023-11-13 DIAGNOSIS — H1045 Other chronic allergic conjunctivitis: Secondary | ICD-10-CM | POA: Diagnosis not present

## 2023-11-13 DIAGNOSIS — H43811 Vitreous degeneration, right eye: Secondary | ICD-10-CM | POA: Diagnosis not present

## 2023-11-13 DIAGNOSIS — H353132 Nonexudative age-related macular degeneration, bilateral, intermediate dry stage: Secondary | ICD-10-CM | POA: Diagnosis not present

## 2023-11-13 LAB — SURGICAL PATHOLOGY
